# Patient Record
Sex: Female | Born: 1945 | Race: Black or African American | Hispanic: No | Marital: Single | Smoking: Never smoker
Health system: Southern US, Community
[De-identification: ages and names within clinical notes are randomized; demographics above are authoritative.]

## PROBLEM LIST (undated history)

## (undated) DIAGNOSIS — N3281 Overactive bladder: Secondary | ICD-10-CM

## (undated) DIAGNOSIS — Z955 Presence of coronary angioplasty implant and graft: Secondary | ICD-10-CM

## (undated) DIAGNOSIS — I10 Essential (primary) hypertension: Secondary | ICD-10-CM

## (undated) DIAGNOSIS — E7800 Pure hypercholesterolemia, unspecified: Secondary | ICD-10-CM

---

## 2010-08-24 ENCOUNTER — Ambulatory Visit (HOSPITAL_COMMUNITY)
Admission: RE | Admit: 2010-08-24 | Discharge: 2010-08-24 | Payer: Self-pay | Source: Home / Self Care | Admitting: Endocrinology

## 2012-09-17 ENCOUNTER — Ambulatory Visit: Payer: BC Managed Care – PPO

## 2013-11-11 ENCOUNTER — Ambulatory Visit: Payer: Self-pay | Admitting: Endocrinology

## 2013-11-11 ENCOUNTER — Other Ambulatory Visit: Payer: Self-pay

## 2013-11-11 ENCOUNTER — Other Ambulatory Visit: Payer: Self-pay | Admitting: *Deleted

## 2013-11-11 DIAGNOSIS — Z0289 Encounter for other administrative examinations: Secondary | ICD-10-CM

## 2013-11-11 DIAGNOSIS — E039 Hypothyroidism, unspecified: Secondary | ICD-10-CM

## 2019-06-25 ENCOUNTER — Other Ambulatory Visit: Payer: Self-pay | Admitting: *Deleted

## 2019-06-25 DIAGNOSIS — Z20822 Contact with and (suspected) exposure to covid-19: Secondary | ICD-10-CM

## 2019-06-29 LAB — NOVEL CORONAVIRUS, NAA: SARS-CoV-2, NAA: NOT DETECTED

## 2021-06-03 ENCOUNTER — Emergency Department (HOSPITAL_BASED_OUTPATIENT_CLINIC_OR_DEPARTMENT_OTHER): Payer: Medicare Other

## 2021-06-03 ENCOUNTER — Emergency Department (HOSPITAL_COMMUNITY): Payer: Medicare Other

## 2021-06-03 ENCOUNTER — Other Ambulatory Visit: Payer: Self-pay

## 2021-06-03 ENCOUNTER — Encounter (HOSPITAL_BASED_OUTPATIENT_CLINIC_OR_DEPARTMENT_OTHER): Payer: Self-pay | Admitting: Urology

## 2021-06-03 ENCOUNTER — Observation Stay (HOSPITAL_BASED_OUTPATIENT_CLINIC_OR_DEPARTMENT_OTHER)
Admission: EM | Admit: 2021-06-03 | Discharge: 2021-06-04 | Disposition: A | Discharge status: Home / Self Care | Payer: Medicare Other | Attending: Internal Medicine | Admitting: Internal Medicine

## 2021-06-03 DIAGNOSIS — N1831 Chronic kidney disease, stage 3a: Secondary | ICD-10-CM | POA: Insufficient documentation

## 2021-06-03 DIAGNOSIS — I2510 Atherosclerotic heart disease of native coronary artery without angina pectoris: Secondary | ICD-10-CM | POA: Insufficient documentation

## 2021-06-03 DIAGNOSIS — E1122 Type 2 diabetes mellitus with diabetic chronic kidney disease: Secondary | ICD-10-CM | POA: Insufficient documentation

## 2021-06-03 DIAGNOSIS — I63219 Cerebral infarction due to unspecified occlusion or stenosis of unspecified vertebral arteries: Secondary | ICD-10-CM | POA: Diagnosis not present

## 2021-06-03 DIAGNOSIS — I639 Cerebral infarction, unspecified: Secondary | ICD-10-CM | POA: Diagnosis present

## 2021-06-03 DIAGNOSIS — R55 Syncope and collapse: Secondary | ICD-10-CM | POA: Insufficient documentation

## 2021-06-03 DIAGNOSIS — Z20822 Contact with and (suspected) exposure to covid-19: Secondary | ICD-10-CM | POA: Insufficient documentation

## 2021-06-03 DIAGNOSIS — R200 Anesthesia of skin: Secondary | ICD-10-CM | POA: Diagnosis present

## 2021-06-03 DIAGNOSIS — Z79899 Other long term (current) drug therapy: Secondary | ICD-10-CM | POA: Diagnosis not present

## 2021-06-03 DIAGNOSIS — E039 Hypothyroidism, unspecified: Secondary | ICD-10-CM | POA: Diagnosis not present

## 2021-06-03 DIAGNOSIS — I129 Hypertensive chronic kidney disease with stage 1 through stage 4 chronic kidney disease, or unspecified chronic kidney disease: Secondary | ICD-10-CM | POA: Insufficient documentation

## 2021-06-03 DIAGNOSIS — I10 Essential (primary) hypertension: Secondary | ICD-10-CM | POA: Diagnosis not present

## 2021-06-03 DIAGNOSIS — R202 Paresthesia of skin: Principal | ICD-10-CM

## 2021-06-03 HISTORY — DX: Essential (primary) hypertension: I10

## 2021-06-03 HISTORY — DX: Presence of coronary angioplasty implant and graft: Z95.5

## 2021-06-03 HISTORY — DX: Overactive bladder: N32.81

## 2021-06-03 HISTORY — DX: Pure hypercholesterolemia, unspecified: E78.00

## 2021-06-03 LAB — CBC
HCT: 38.4 % (ref 36.0–46.0)
HCT: 38.8 % (ref 36.0–46.0)
Hemoglobin: 12 g/dL (ref 12.0–15.0)
Hemoglobin: 11.9 g/dL — ABNORMAL LOW (ref 12.0–15.0)
MCH: 26.1 pg (ref 26.0–34.0)
MCH: 26.3 pg (ref 26.0–34.0)
MCHC: 31.3 g/dL (ref 30.0–36.0)
MCHC: 30.7 g/dL (ref 30.0–36.0)
MCV: 83.7 fL (ref 80.0–100.0)
MCV: 85.7 fL (ref 80.0–100.0)
Platelets: 275 10*3/uL (ref 150–400)
Platelets: 286 10*3/uL (ref 150–400)
RBC: 4.59 MIL/uL (ref 3.87–5.11)
RBC: 4.53 MIL/uL (ref 3.87–5.11)
RDW: 15.4 % (ref 11.5–15.5)
RDW: 15.3 % (ref 11.5–15.5)
WBC: 15.8 10*3/uL — ABNORMAL HIGH (ref 4.0–10.5)
WBC: 12.5 10*3/uL — ABNORMAL HIGH (ref 4.0–10.5)
nRBC: 0 % (ref 0.0–0.2)
nRBC: 0 % (ref 0.0–0.2)

## 2021-06-03 LAB — LIPID PANEL
Cholesterol: 129 mg/dL (ref 0–200)
HDL: 60 mg/dL (ref >40)
LDL Cholesterol: 53 mg/dL (ref 0–99)
Total CHOL/HDL Ratio: 2.2 RATIO
Triglycerides: 80 mg/dL (ref <150)
VLDL: 16 mg/dL (ref 0–40)

## 2021-06-03 LAB — URINALYSIS, ROUTINE W REFLEX MICROSCOPIC
Bilirubin Urine: NEGATIVE
Glucose, UA: NEGATIVE mg/dL
Ketones, ur: NEGATIVE mg/dL
Nitrite: NEGATIVE
Protein, ur: 30 mg/dL — AB
Specific Gravity, Urine: 1.016 (ref 1.005–1.030)
pH: 7 (ref 5.0–8.0)

## 2021-06-03 LAB — COMPREHENSIVE METABOLIC PANEL
ALT: 37 U/L (ref 0–44)
AST: 31 U/L (ref 15–41)
Albumin: 4.1 g/dL (ref 3.5–5.0)
Alkaline Phosphatase: 100 U/L (ref 38–126)
Anion gap: 10 (ref 5–15)
BUN: 23 mg/dL (ref 8–23)
CO2: 25 mmol/L (ref 22–32)
Calcium: 8.6 mg/dL — ABNORMAL LOW (ref 8.9–10.3)
Chloride: 104 mmol/L (ref 98–111)
Creatinine, Ser: 1.26 mg/dL — ABNORMAL HIGH (ref 0.44–1.00)
GFR, Estimated: 45 mL/min — ABNORMAL LOW (ref >60)
Glucose, Bld: 125 mg/dL — ABNORMAL HIGH (ref 70–99)
Potassium: 3.2 mmol/L — ABNORMAL LOW (ref 3.5–5.1)
Sodium: 139 mmol/L (ref 135–145)
Total Bilirubin: 0.5 mg/dL (ref 0.3–1.2)
Total Protein: 8 g/dL (ref 6.5–8.1)

## 2021-06-03 LAB — DIFFERENTIAL
Abs Immature Granulocytes: 0.07 10*3/uL (ref 0.00–0.07)
Basophils Absolute: 0 10*3/uL (ref 0.0–0.1)
Basophils Relative: 0 %
Eosinophils Absolute: 0.1 10*3/uL (ref 0.0–0.5)
Eosinophils Relative: 1 %
Immature Granulocytes: 0 %
Lymphocytes Relative: 14 %
Lymphs Abs: 2.2 10*3/uL (ref 0.7–4.0)
Monocytes Absolute: 1 10*3/uL (ref 0.1–1.0)
Monocytes Relative: 6 %
Neutro Abs: 12.4 10*3/uL — ABNORMAL HIGH (ref 1.7–7.7)
Neutrophils Relative %: 79 %

## 2021-06-03 LAB — RAPID URINE DRUG SCREEN, HOSP PERFORMED
Amphetamines: NOT DETECTED
Barbiturates: NOT DETECTED
Benzodiazepines: NOT DETECTED
Cocaine: NOT DETECTED
Opiates: NOT DETECTED
Tetrahydrocannabinol: NOT DETECTED

## 2021-06-03 LAB — TROPONIN I (HIGH SENSITIVITY)
Troponin I (High Sensitivity): 11 ng/L (ref <18)
Troponin I (High Sensitivity): 17 ng/L (ref <18)

## 2021-06-03 LAB — RESP PANEL BY RT-PCR (FLU A&B, COVID) ARPGX2
Influenza A by PCR: NEGATIVE
Influenza B by PCR: NEGATIVE
SARS Coronavirus 2 by RT PCR: NEGATIVE

## 2021-06-03 LAB — APTT: aPTT: 29 seconds (ref 24–36)

## 2021-06-03 LAB — PROTIME-INR
INR: 1.1 (ref 0.8–1.2)
Prothrombin Time: 14.3 seconds (ref 11.4–15.2)

## 2021-06-03 LAB — CREATININE, SERUM
Creatinine, Ser: 1.1 mg/dL — ABNORMAL HIGH (ref 0.44–1.00)
GFR, Estimated: 52 mL/min — ABNORMAL LOW (ref >60)

## 2021-06-03 LAB — TSH: TSH: 2.525 u[IU]/mL (ref 0.350–4.500)

## 2021-06-03 LAB — MAGNESIUM: Magnesium: 2.4 mg/dL (ref 1.7–2.4)

## 2021-06-03 LAB — PHOSPHORUS: Phosphorus: 2.4 mg/dL — ABNORMAL LOW (ref 2.5–4.6)

## 2021-06-03 LAB — ETHANOL: Alcohol, Ethyl (B): <10 mg/dL (ref <10)

## 2021-06-03 IMAGING — MR MR MRA HEAD W/O CM
1 of 2 series · 19 of 48 positions shown · IV contrast (gadavist)
Comparison: None.

CLINICAL DATA: Stroke follow-up

EXAM:
MRA NECK WITHOUT AND WITH CONTRAST
MRA HEAD WITHOUT CONTRAST
TECHNIQUE: Multiplanar and multiecho pulse sequences of the neck were obtained
without and with intravenous contrast. Angiographic images of the
neck were obtained using MRA technique without and with intravenous
contrast; Angiographic images of the Circle of Willis were obtained
using MRA technique without intravenous contrast.
CONTRAST:  9mL GADAVIST GADOBUTROL 1 MMOL/ML IV SOLN

[Series 3: ax (id) · axial · 1.0mm · 0.43mm/px · z∈[-99,-13]mm · 19 of 187 slices shown]
[im 1/187]
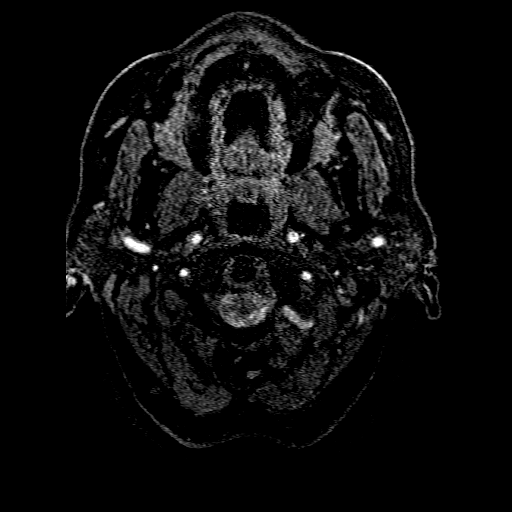
[im 8/187]
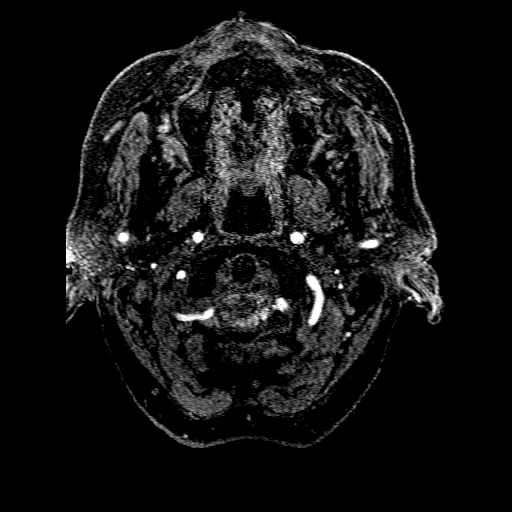
[im 15/187]
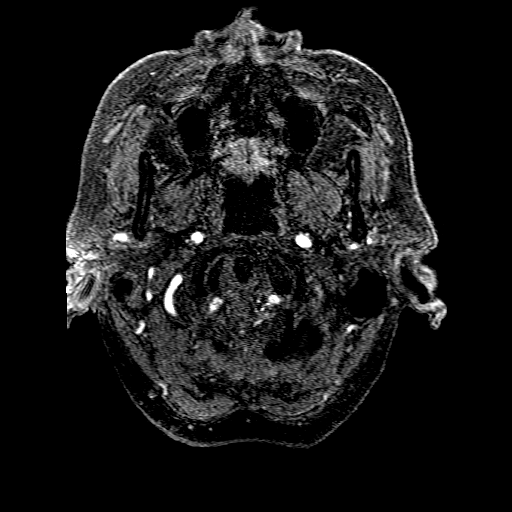
[im 23/187]
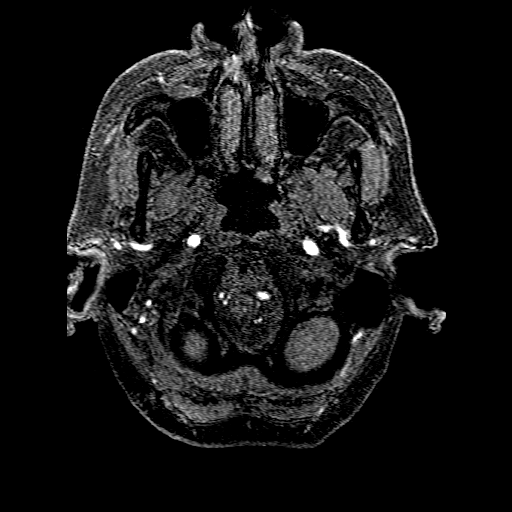
[im 30/187]
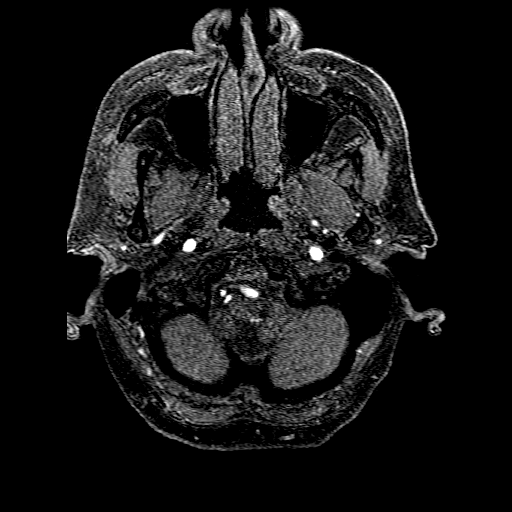
[im 38/187]
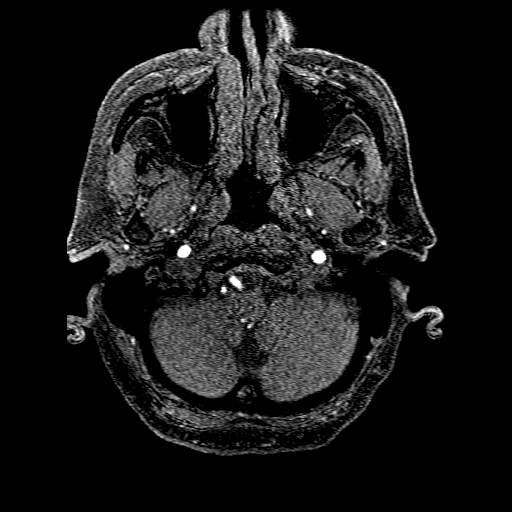
[im 45/187]
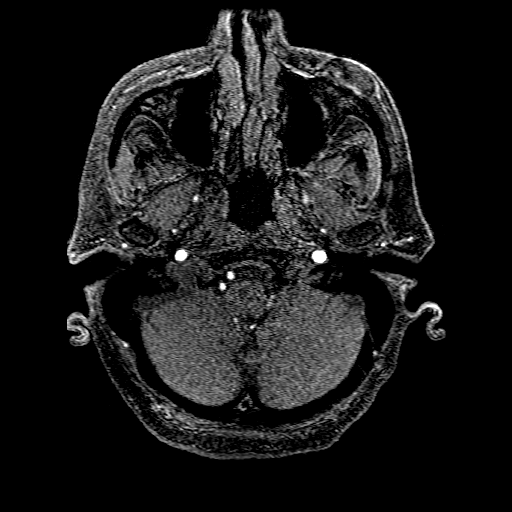
[im 53/187]
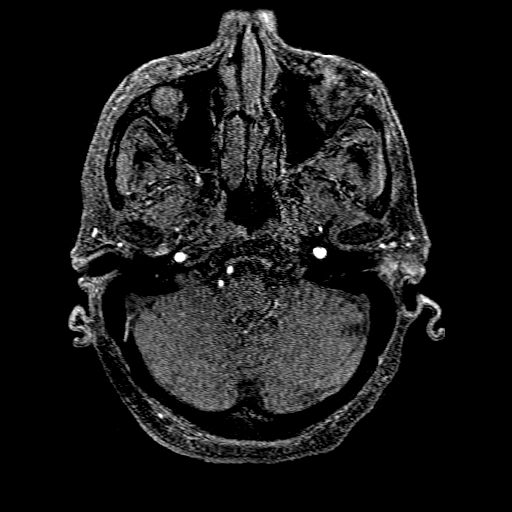
[im 60/187]
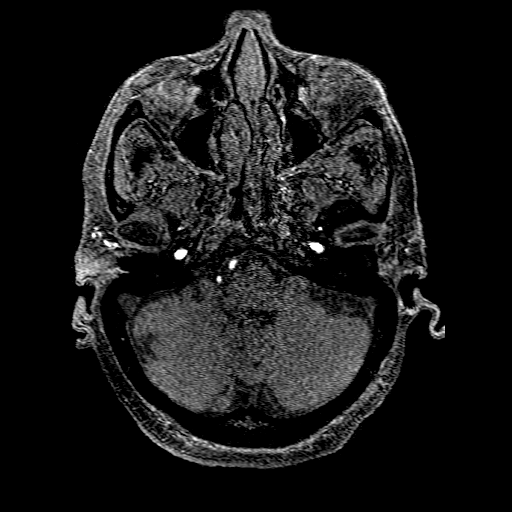
[im 67/187]
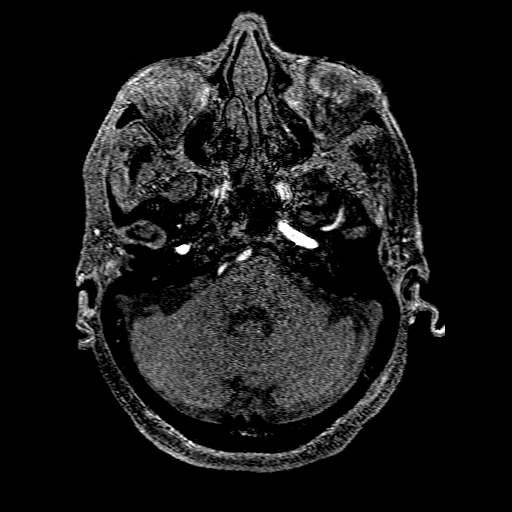
[im 75/187]
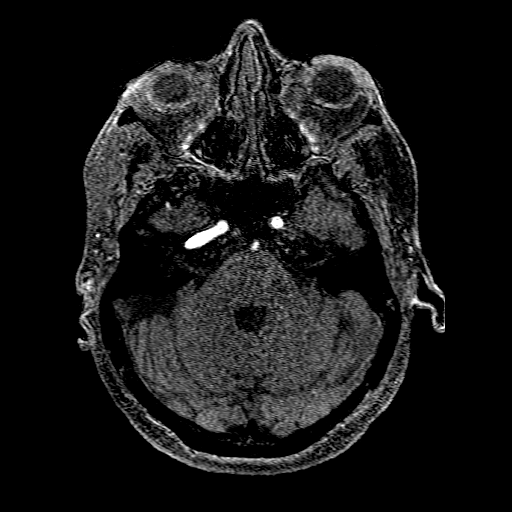
[im 82/187]
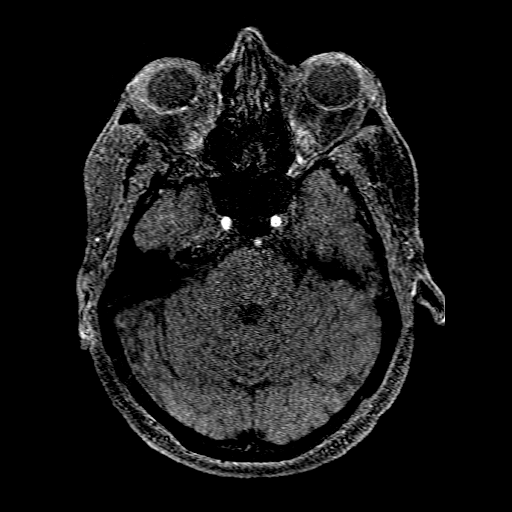
[im 90/187]
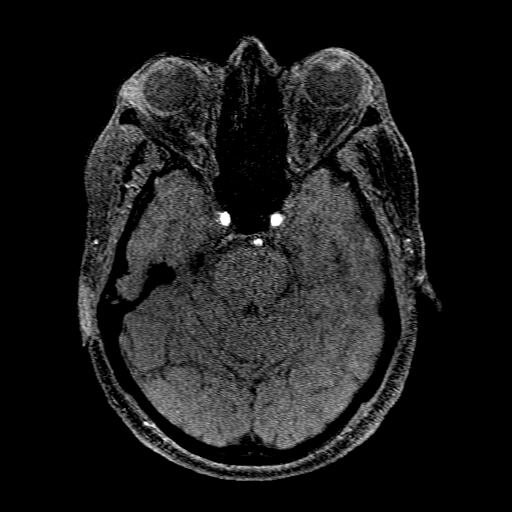
[im 97/187]
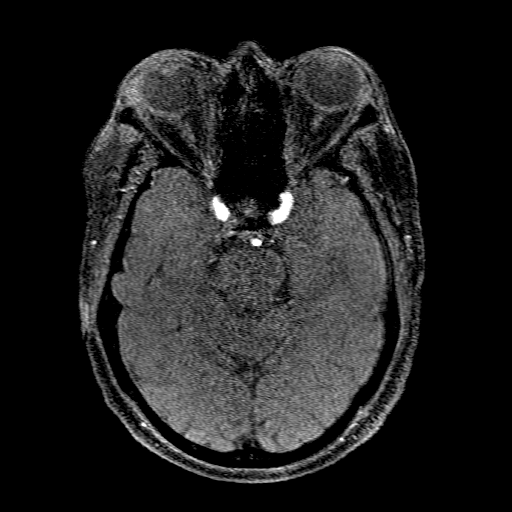
[im 105/187]
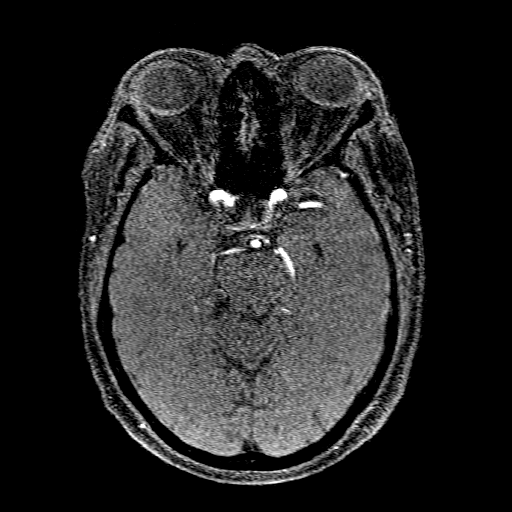
[im 112/187]
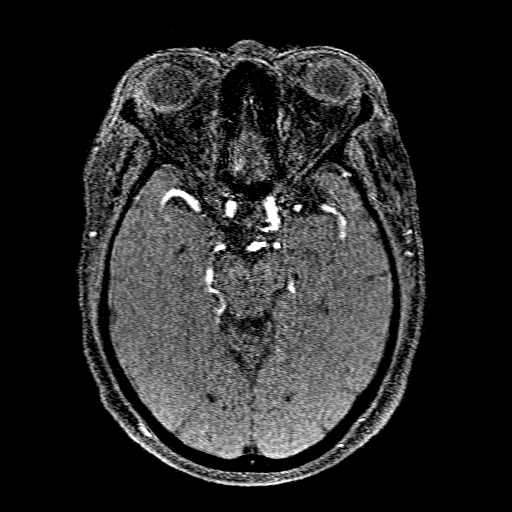
[im 127/187]
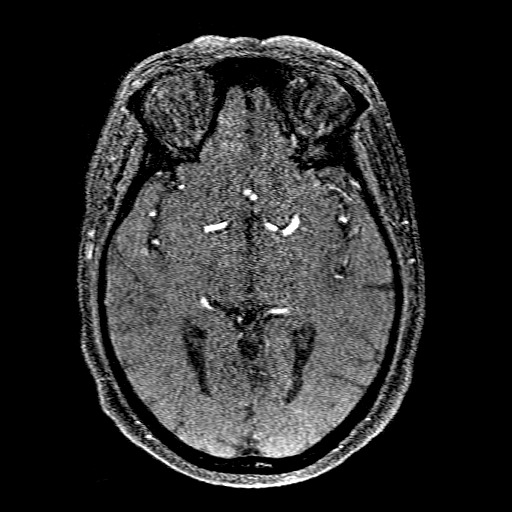
[im 157/187]
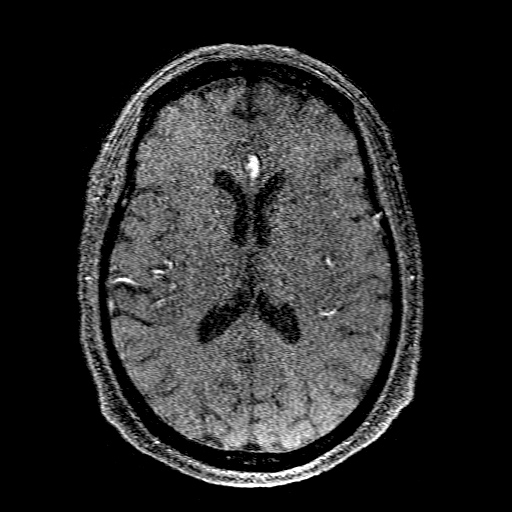
[im 179/187]
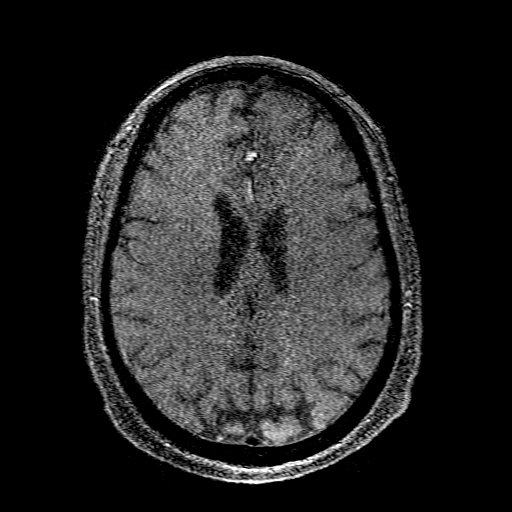

[19 of 48 positions shown; findings below may reference images not displayed]

FINDINGS: MRA NECK FINDINGS

Normal carotid and vertebral artery systems within the neck.

MRA HEAD FINDINGS

POSTERIOR CIRCULATION:

--Vertebral arteries: Normal

--Inferior cerebellar arteries: Normal.

--Basilar artery: Normal.

--Superior cerebellar arteries: Normal.

--Posterior cerebral arteries: Normal.

ANTERIOR CIRCULATION:

--Intracranial internal carotid arteries: Normal.

--Anterior cerebral arteries (ACA): Normal.

--Middle cerebral arteries (MCA): Normal.

ANATOMIC VARIANTS: None
IMPRESSION: Normal MRA of the head and neck.

## 2021-06-03 IMAGING — CT CT HEAD W/O CM
3 series · 15 of 47 positions shown, 18 images · non-contrast
Comparison: None.

CLINICAL DATA: Reported recent CVA

EXAM:
CT HEAD WITHOUT CONTRAST
TECHNIQUE: Contiguous axial images were obtained from the base of the skull
through the vertex without intravenous contrast.

[Series 2: head wo · axial · 0.44mm/px · z∈[+773,+898]mm · 9 of 30 slices shown, 12 images]
[im 3/30  brain]
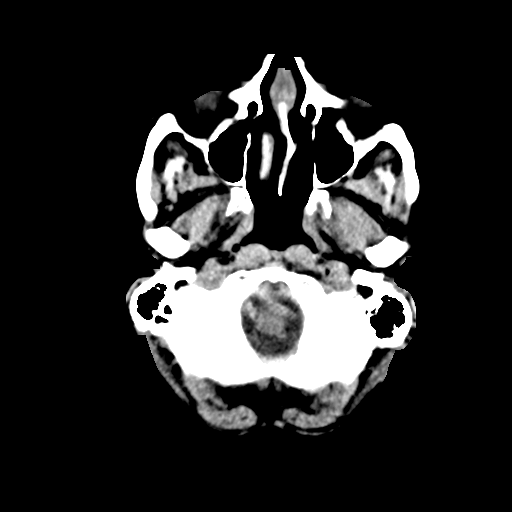
[im 3/30  bone]
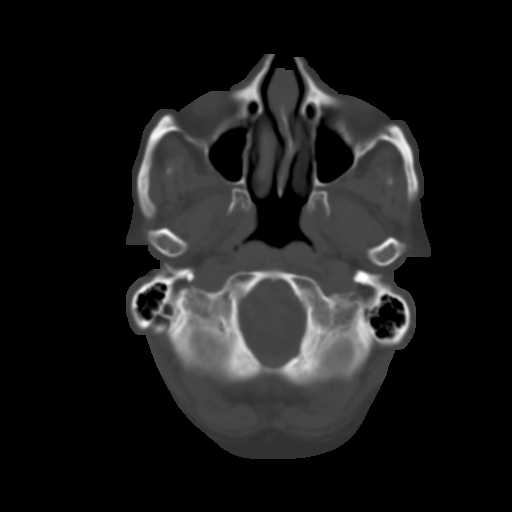
[im 6/30  brain]
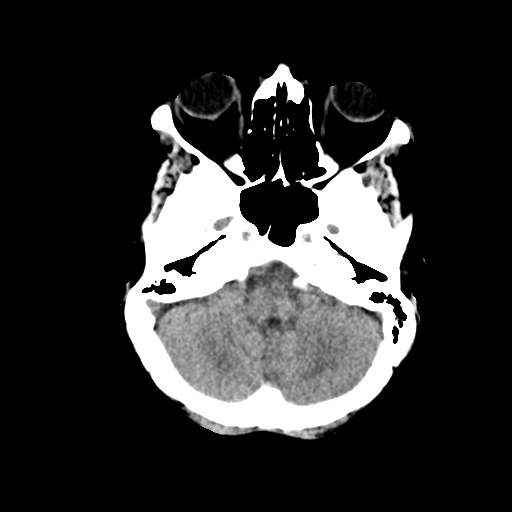
[im 9/30  brain]
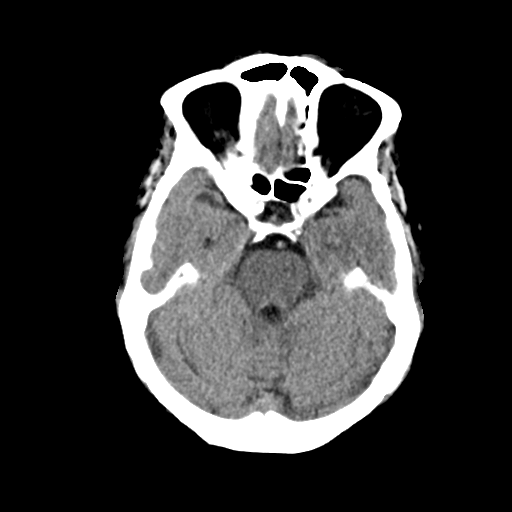
[im 12/30  brain]
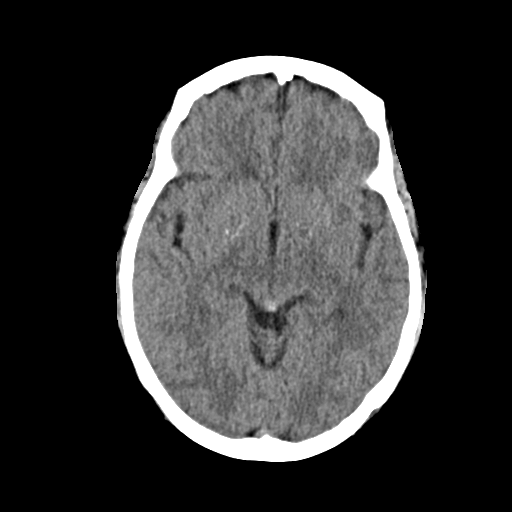
[im 16/30  brain]
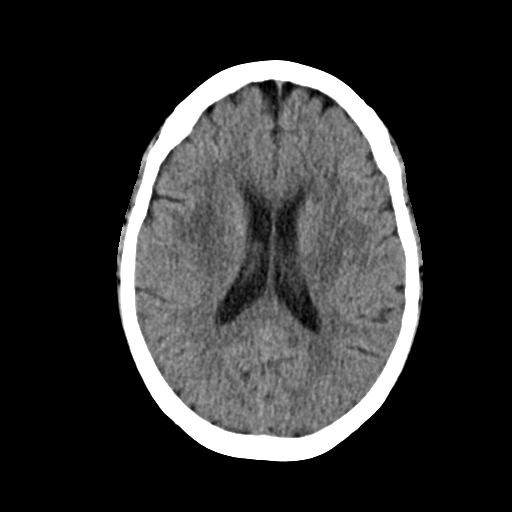
[im 16/30  bone]
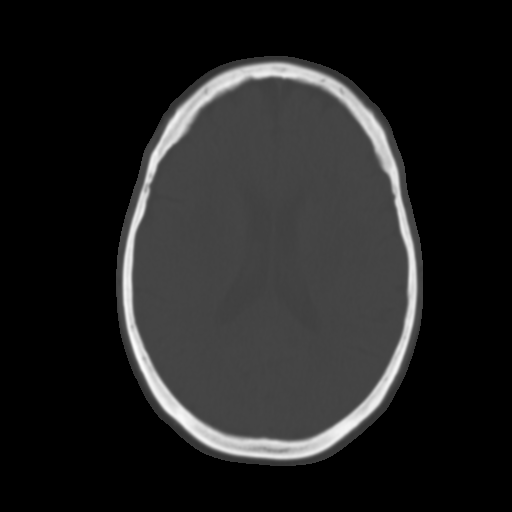
[im 19/30  brain]
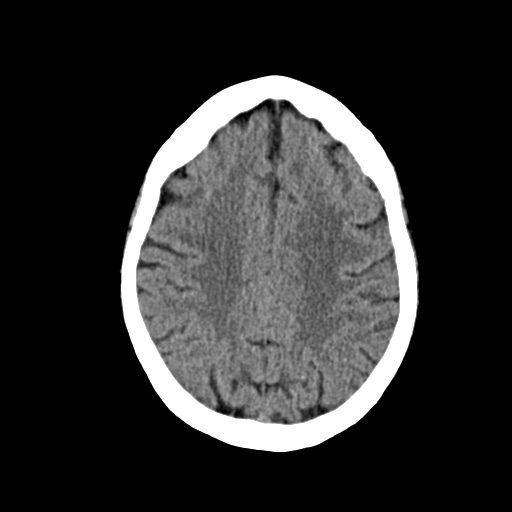
[im 22/30  brain]
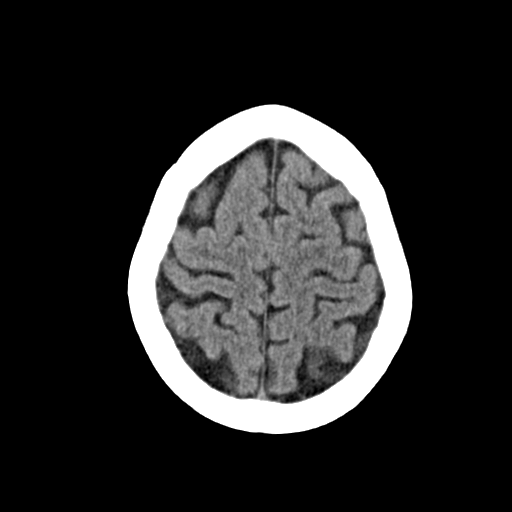
[im 25/30  brain]
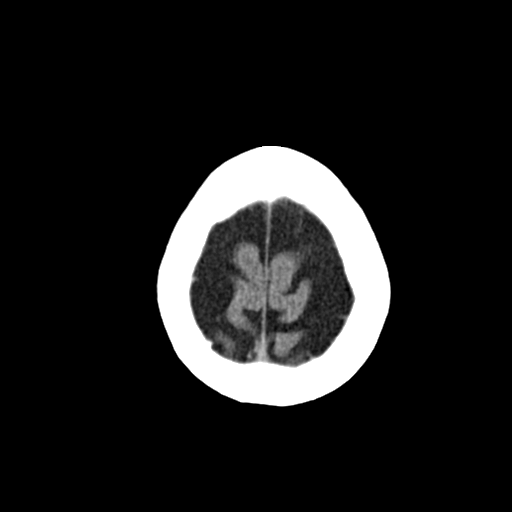
[im 28/30  brain]
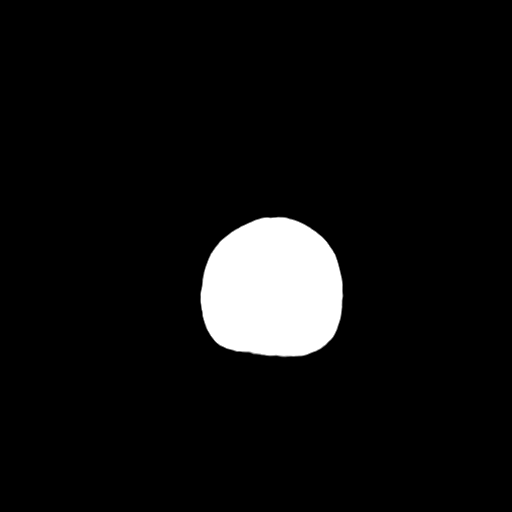
[im 28/30  bone]
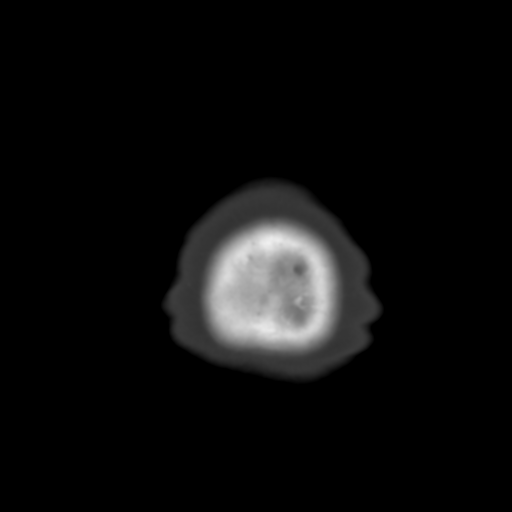

[Series 4: coronal soft · coronal · 0.31mm/px · 3 of 66 slices shown]
[im 22/66  brain]
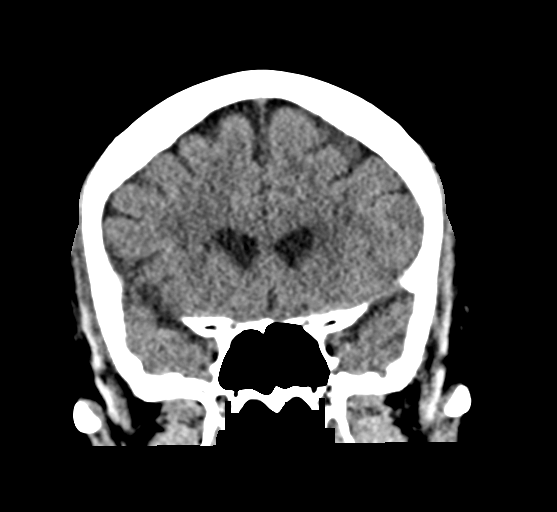
[im 29/66  brain]
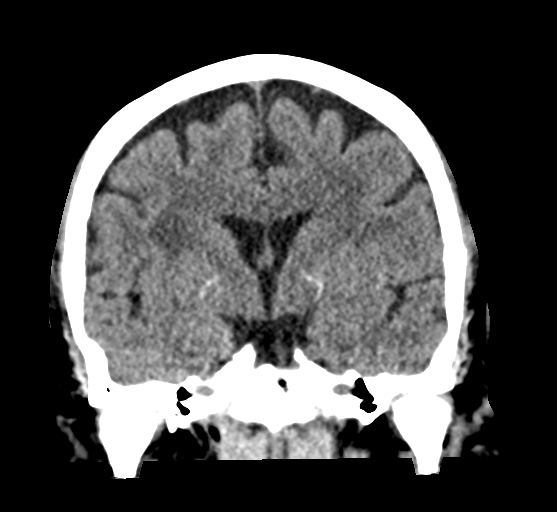
[im 37/66  brain]
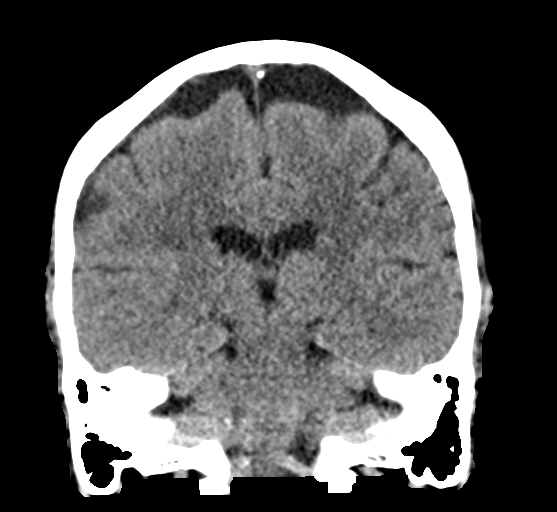

[Series 5: sag soft · sagittal · 0.31mm/px · 3 of 55 slices shown]
[im 19/55  brain]
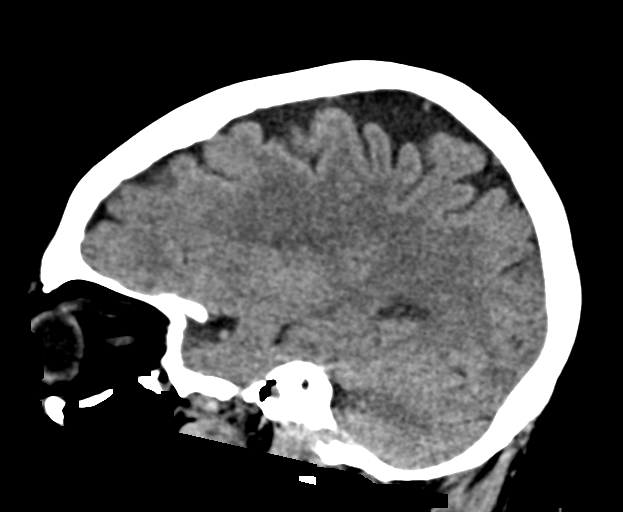
[im 28/55  brain]
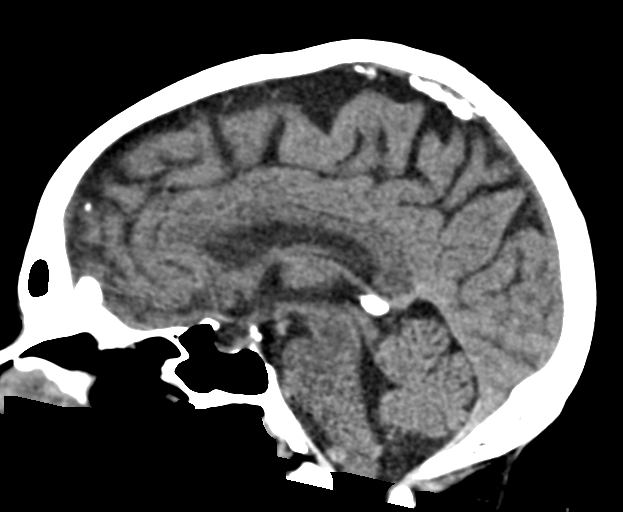
[im 37/55  brain]
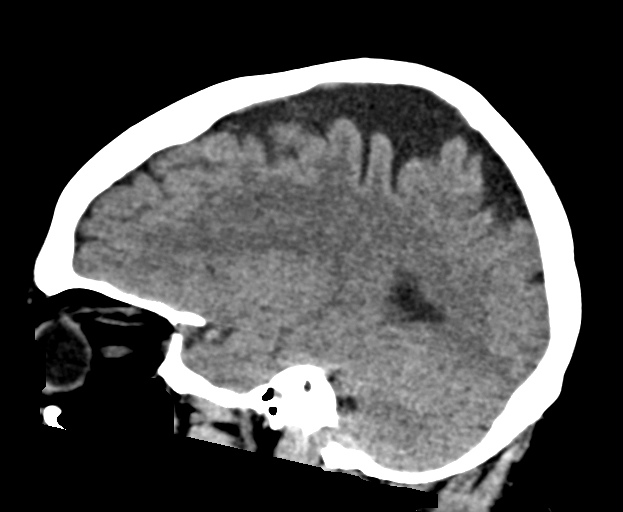

[15 of 47 positions shown; findings below may reference images not displayed]

FINDINGS: Brain: There is symmetric superior frontal and parietal lobe
atrophy. There is no intracranial mass, hemorrhage, extra-axial
fluid collection, or midline shift. There is patchy decreased
attenuation in portions of each centrum semiovale consistent with
underlying periventricular small vessel disease. No well-defined
acute infarct is evident on this study.

Vascular: No hyperdense vessel. There is calcification in each
carotid siphon region.

Skull: Bony calvarium appears intact.

Sinuses/Orbits: Slight mucosal thickening in several ethmoid air
cells. Other visualized paranasal sinuses are clear. Orbits appear
symmetric bilaterally.

Other: Mastoid air cells clear.
IMPRESSION: Areas of superior frontal and parietal lobe atrophy. Patchy
periventricular small vessel disease noted. No acute infarct is
appreciable on this study. No mass or hemorrhage.

There are foci of arterial vascular calcification. There is mild
ethmoid paranasal sinus disease.

## 2021-06-03 IMAGING — MR MR MRA NECK WO/W CM
4 of 6 series · 19 of 48 positions shown · IV contrast (Yes GAD)
Comparison: None.

CLINICAL DATA: Stroke follow-up

EXAM:
MRA NECK WITHOUT AND WITH CONTRAST
MRA HEAD WITHOUT CONTRAST
TECHNIQUE: Multiplanar and multiecho pulse sequences of the neck were obtained
without and with intravenous contrast. Angiographic images of the
neck were obtained using MRA technique without and with intravenous
contrast; Angiographic images of the Circle of Willis were obtained
using MRA technique without intravenous contrast.
CONTRAST:  9mL GADAVIST GADOBUTROL 1 MMOL/ML IV SOLN

[Series 600: cor cemra ft · coronal · 1.2mm · 0.59mm/px · 9 of 109 slices shown]
[im 1/109]
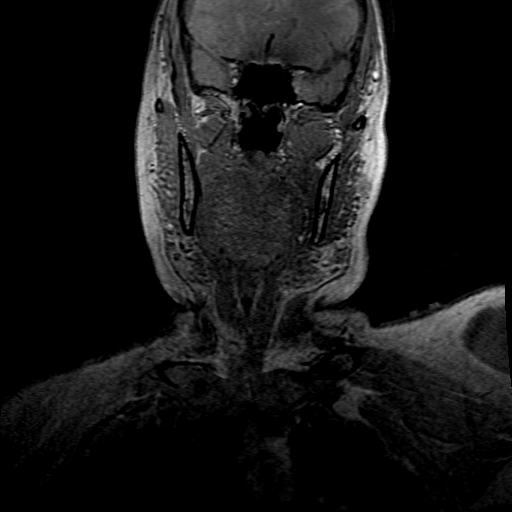
[im 14/109]
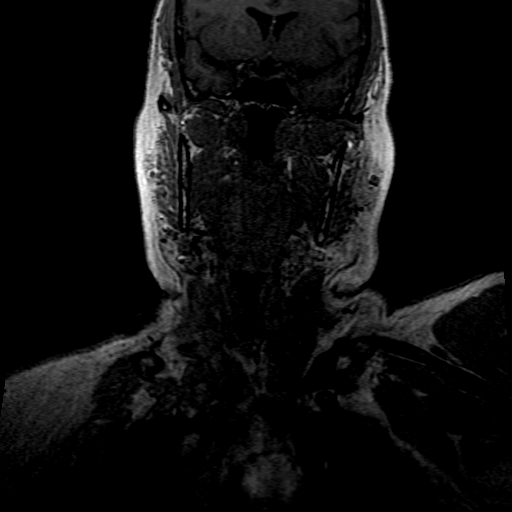
[im 28/109]
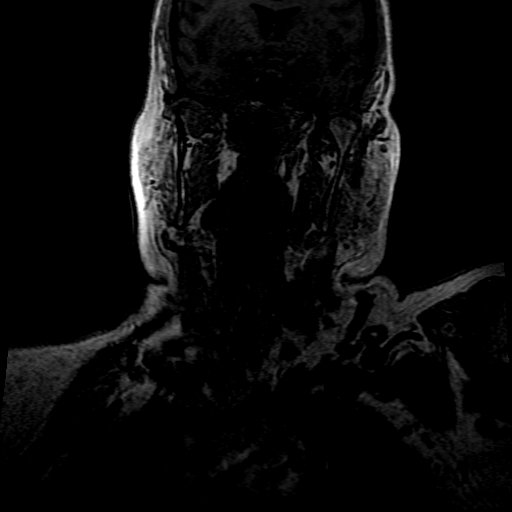
[im 41/109]
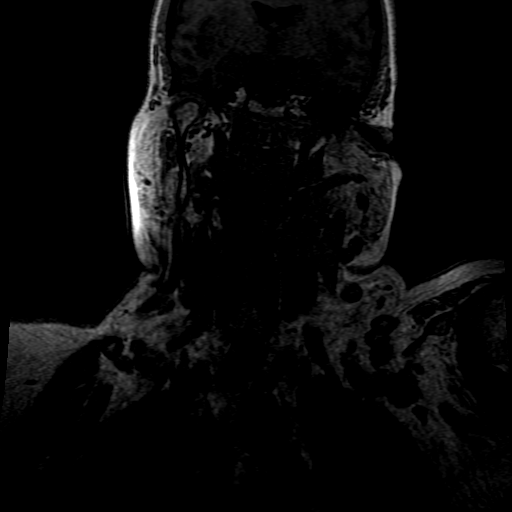
[im 55/109]
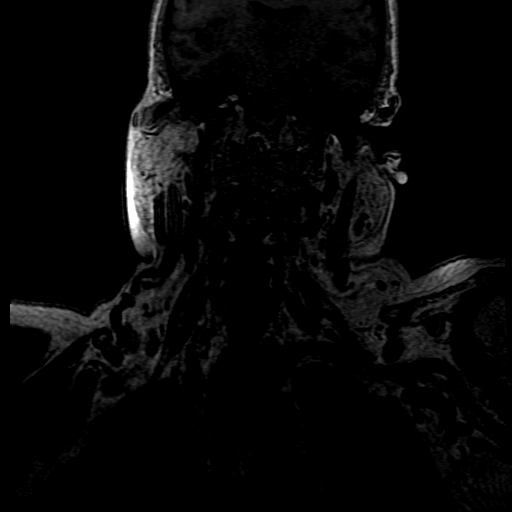
[im 68/109]
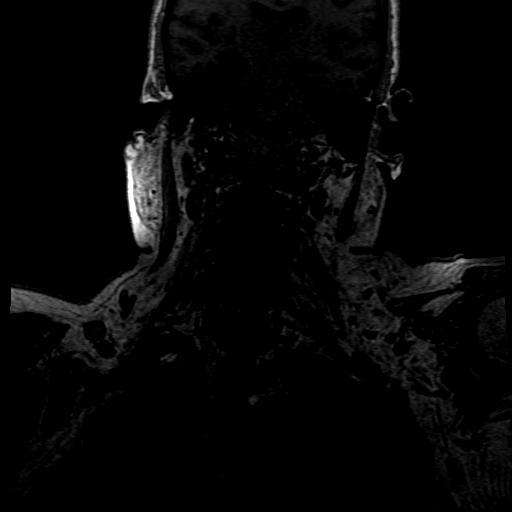
[im 82/109]
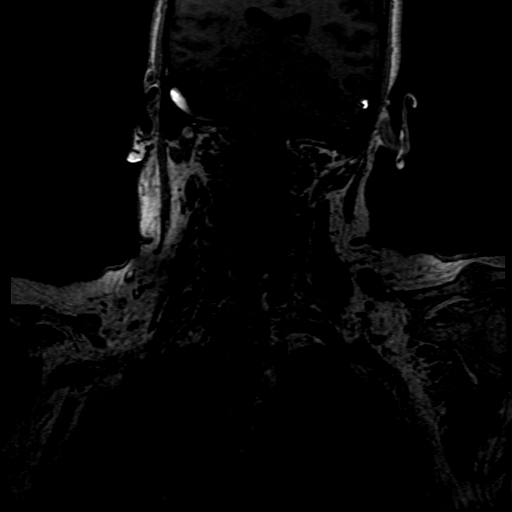
[im 95/109]
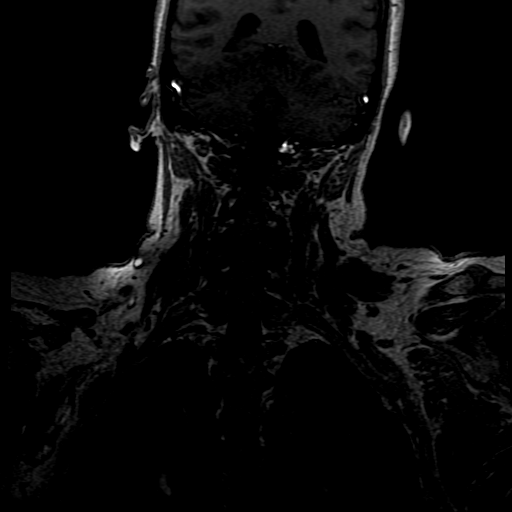
[im 109/109]
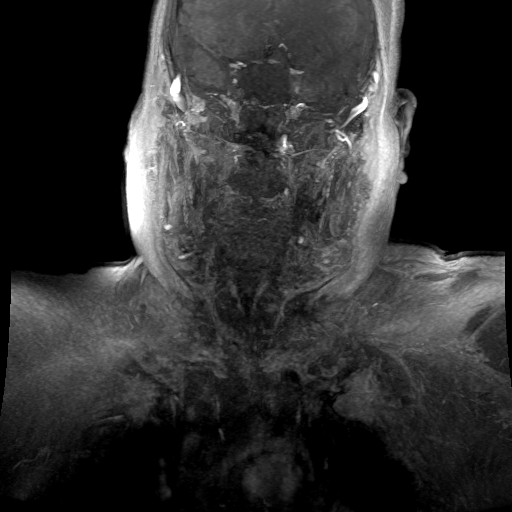

[Series 601: ph1/cor cemra ft · coronal · 1.2mm · 0.59mm/px · 4 of 109 slices shown]
[im 1/109]
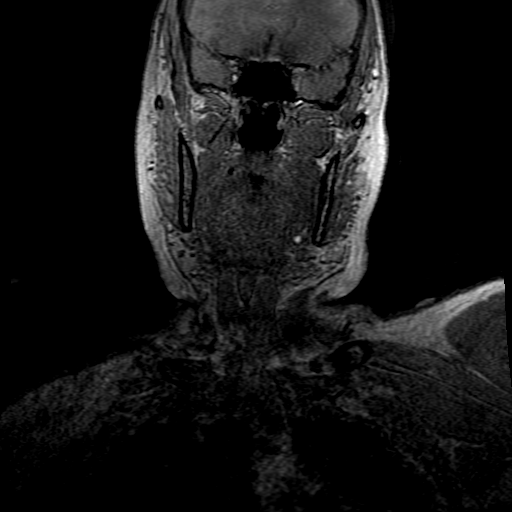
[im 14/109]
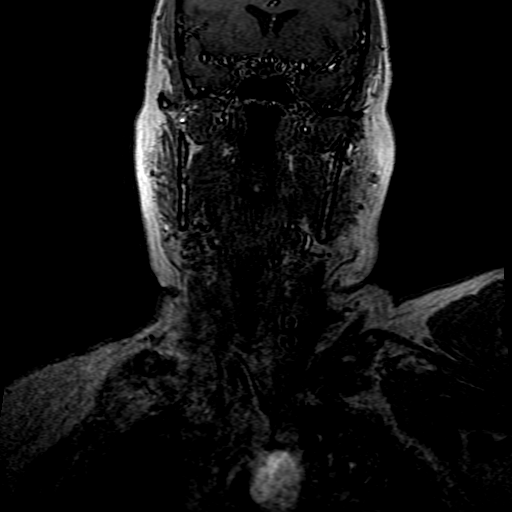
[im 55/109]
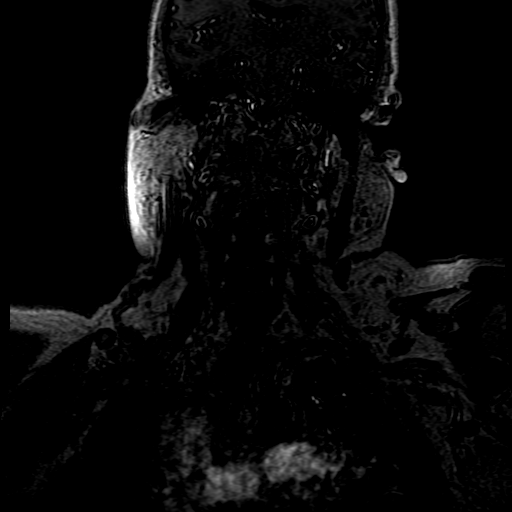
[im 95/109]
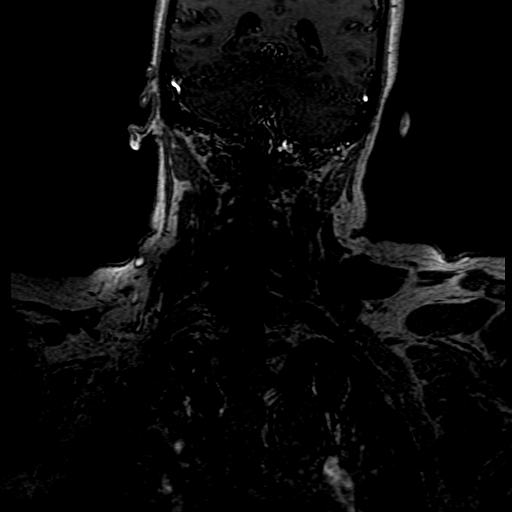

[Series 602: ph2/cor cemra ft · coronal · 1.2mm · 0.59mm/px · 3 of 108 slices shown]
[im 14/108]
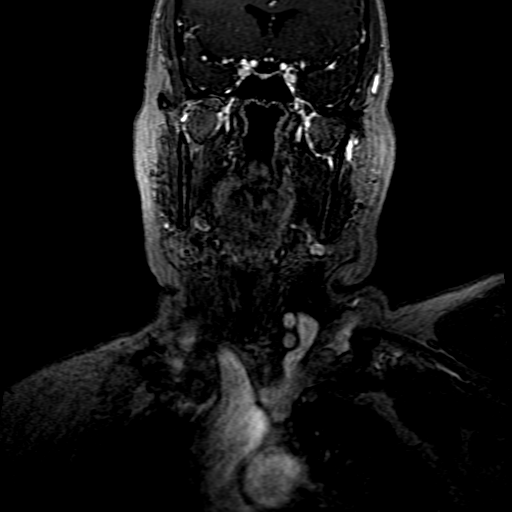
[im 54/108]
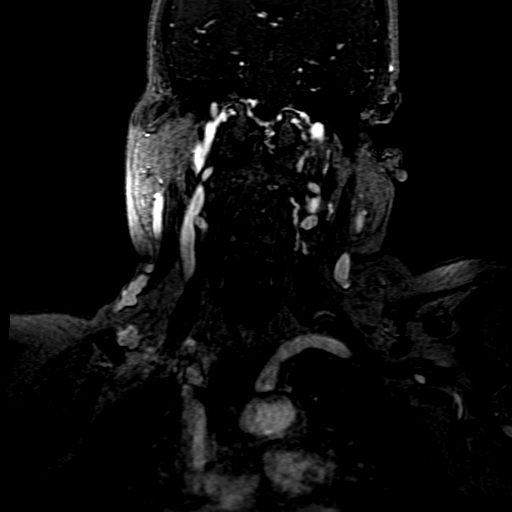
[im 94/108]
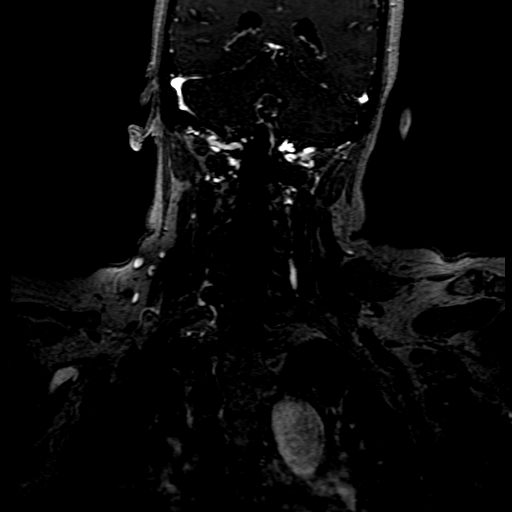

[((date))-((date)) · coronal · 1.2mm · 0.59mm/px · 3 of 109 slices shown]
[im 13/109]
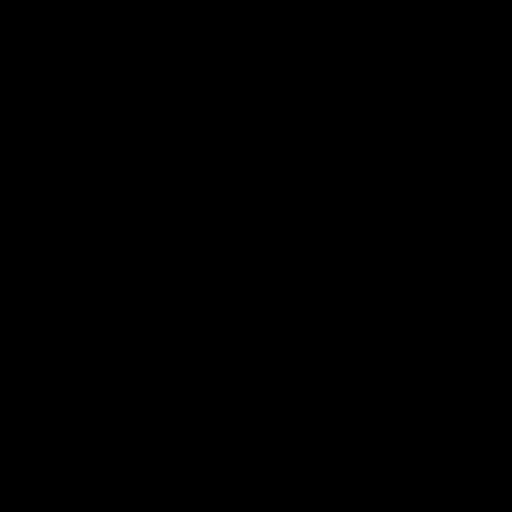
[im 61/109]
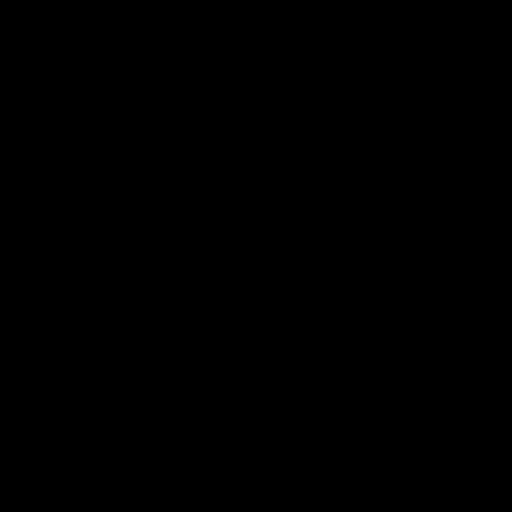
[im 97/109]
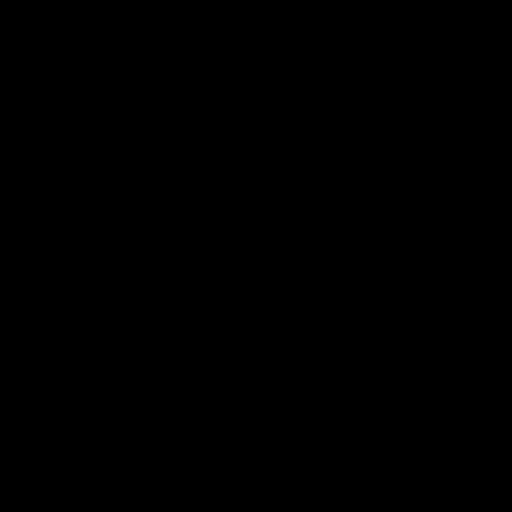

[19 of 48 positions shown; findings below may reference images not displayed]

FINDINGS: MRA NECK FINDINGS

Normal carotid and vertebral artery systems within the neck.

MRA HEAD FINDINGS

POSTERIOR CIRCULATION:

--Vertebral arteries: Normal

--Inferior cerebellar arteries: Normal.

--Basilar artery: Normal.

--Superior cerebellar arteries: Normal.

--Posterior cerebral arteries: Normal.

ANTERIOR CIRCULATION:

--Intracranial internal carotid arteries: Normal.

--Anterior cerebral arteries (ACA): Normal.

--Middle cerebral arteries (MCA): Normal.

ANATOMIC VARIANTS: None
IMPRESSION: Normal MRA of the head and neck.

## 2021-06-03 IMAGING — MR MR HEAD W/O CM
12 of 13 series · 44 of 48 positions shown · non-contrast
Comparison: Head CT same day

CLINICAL DATA: Numbness, tingling and paresthesia.

EXAM:
MRI HEAD WITHOUT CONTRAST
TECHNIQUE: Multiplanar, multiecho pulse sequences of the brain and surrounding
structures were obtained without intravenous contrast.

[Series 5: DWI · axial · 3.0mm · 0.88mm/px · z∈[-103,+37]mm · 8 of 96 slices shown (1 of 4)]
[im 1/96]
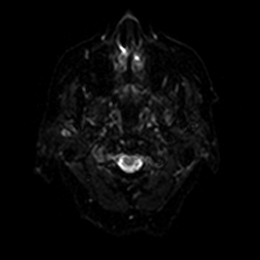
[im 14/96]
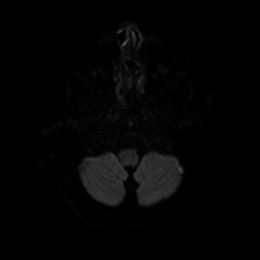
[im 28/96]
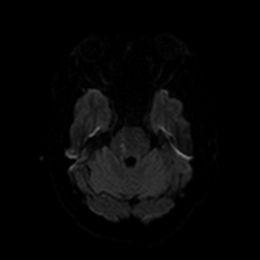
[im 41/96]
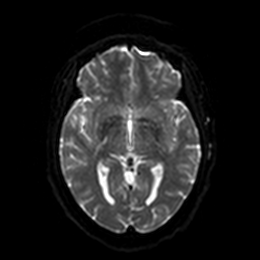
[im 55/96]
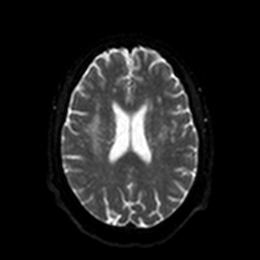
[im 68/96]
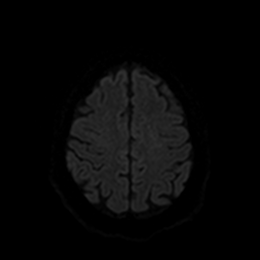
[im 82/96]
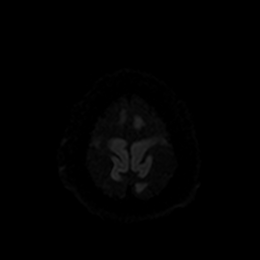
[im 96/96]
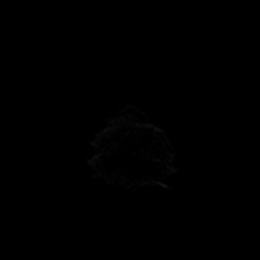

[Series 6: DWI · axial · 3.0mm · 0.88mm/px · z∈[-103,+37]mm · 4 of 48 slices shown (2 of 4)]
[im 1/48]
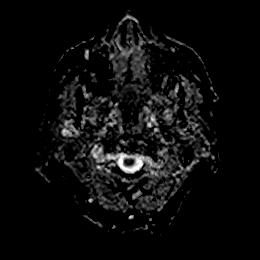
[im 16/48]
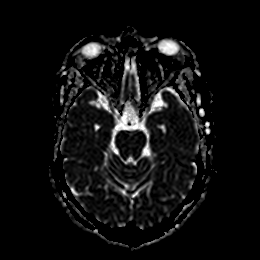
[im 32/48]
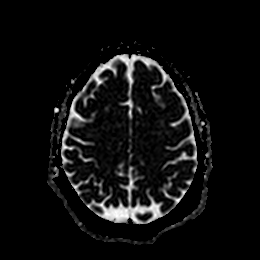
[im 48/48]
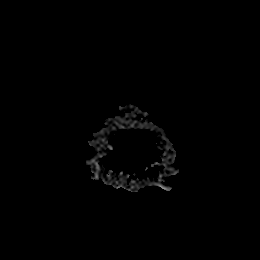

[Series 7: DWI · coronal · 4.0mm · 0.88mm/px · 5 of 70 slices shown (3 of 4)]
[im 1/70]
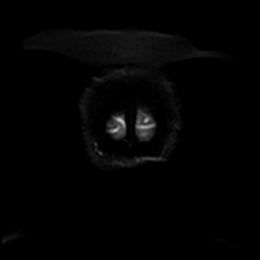
[im 18/70]
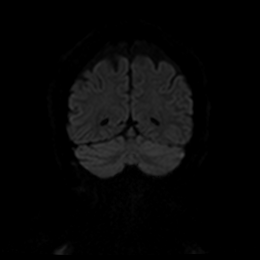
[im 35/70]
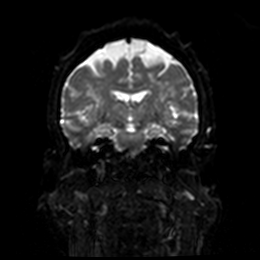
[im 52/70]
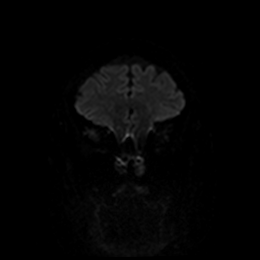
[im 70/70]
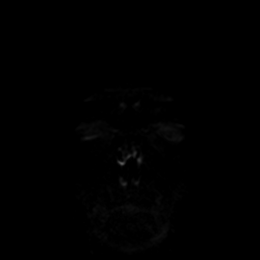

[Series 8: DWI · coronal · 4.0mm · 0.88mm/px · 3 of 35 slices shown (4 of 4)]
[im 1/35]
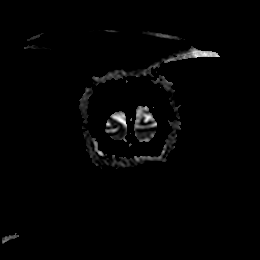
[im 18/35]
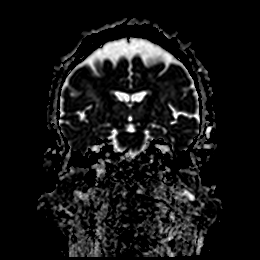
[im 35/35]
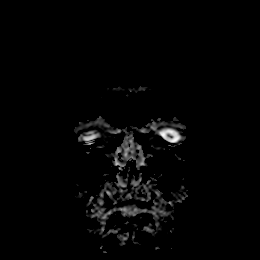

[Series 9: T1 · sagittal · 5.0mm · 0.75mm/px · 2 of 23 slices shown]
[im 1/23]
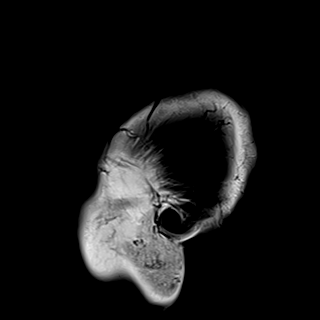
[im 23/23]
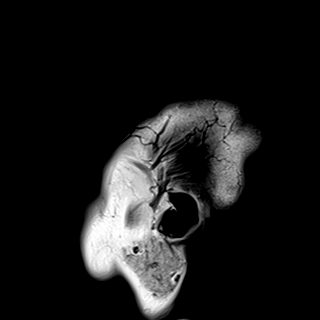

[Series 10: T2 · axial · 5.0mm · 0.72mm/px · z∈[-106,+38]mm · 2 of 25 slices shown (1 of 2)]
[im 1/25]
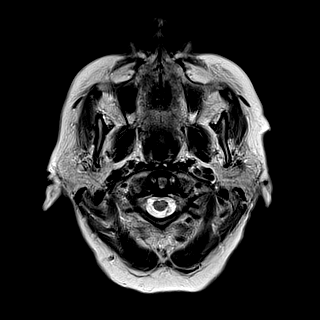
[im 25/25]
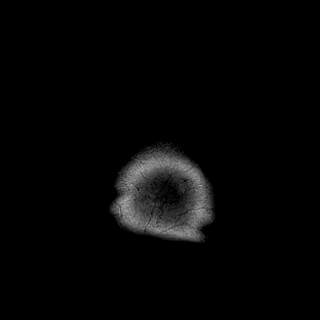

[Series 11: FLAIR · axial · 5.0mm · 0.45mm/px · z∈[-106,+38]mm · 2 of 25 slices shown]
[im 1/25]
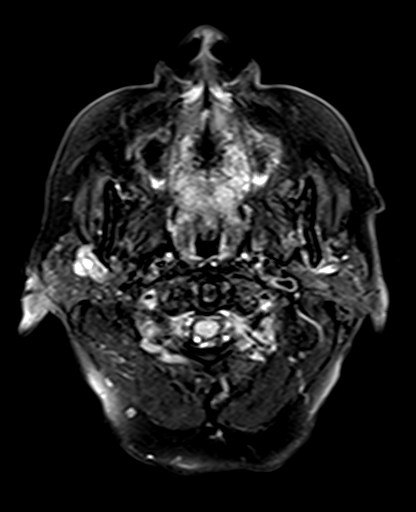
[im 25/25]
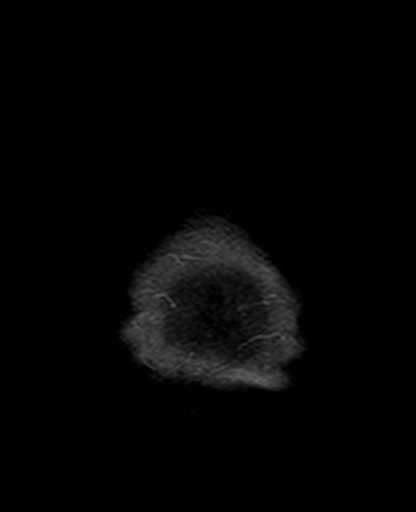

[Series 12: mag_images · axial · 3.0mm · 0.90mm/px · z∈[-124,+53]mm · 4 of 60 slices shown]
[im 1/60]
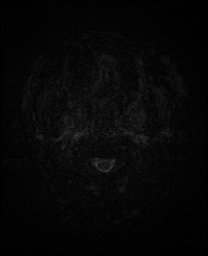
[im 20/60]
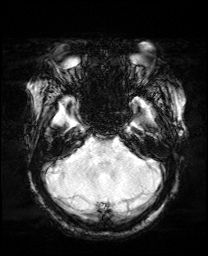
[im 40/60]
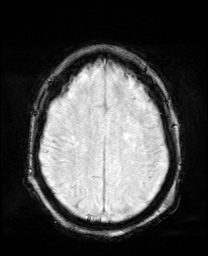
[im 60/60]
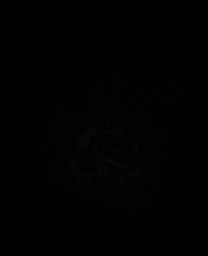

[Series 13: pha_images · axial · 3.0mm · 0.90mm/px · z∈[-124,+50]mm · 4 of 59 slices shown]
[im 1/59]
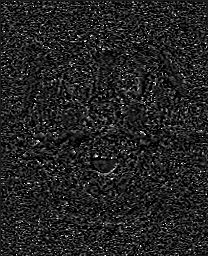
[im 20/59]
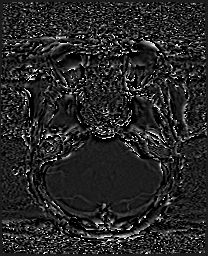
[im 39/59]
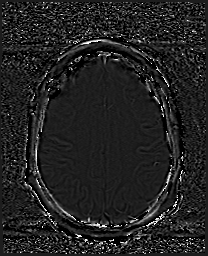
[im 59/59]
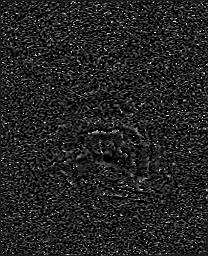

[Series 14: swi_images · axial · 3.0mm · 0.90mm/px · z∈[-124,+53]mm · 4 of 60 slices shown]
[im 1/60]
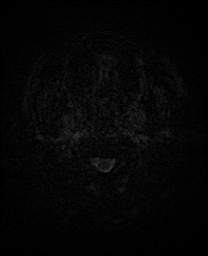
[im 20/60]
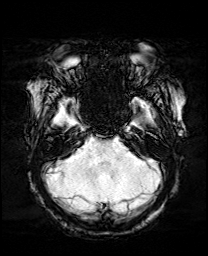
[im 40/60]
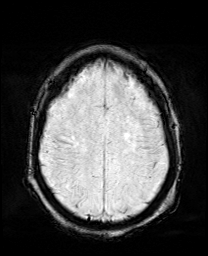
[im 60/60]
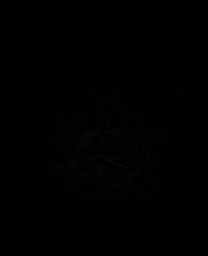

[Series 15: mip_images(sw) · axial · 24.0mm · 0.90mm/px · z∈[-113,+42]mm · 4 of 53 slices shown]
[im 1/53]
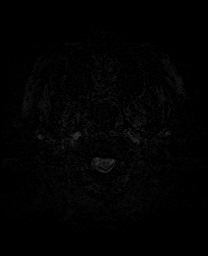
[im 18/53]
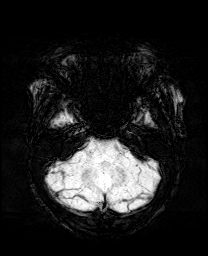
[im 35/53]
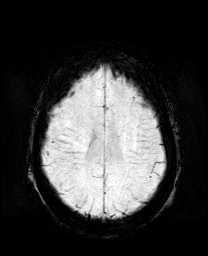
[im 53/53]
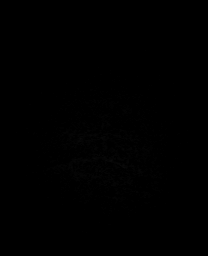

[Series 17: T2 · coronal · 5.0mm · 0.34mm/px · 2 of 30 slices shown (2 of 2)]
[im 1/30]
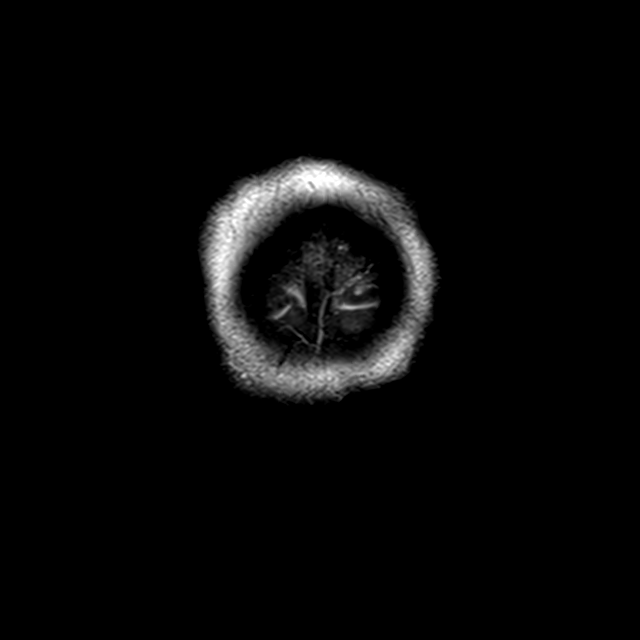
[im 30/30]
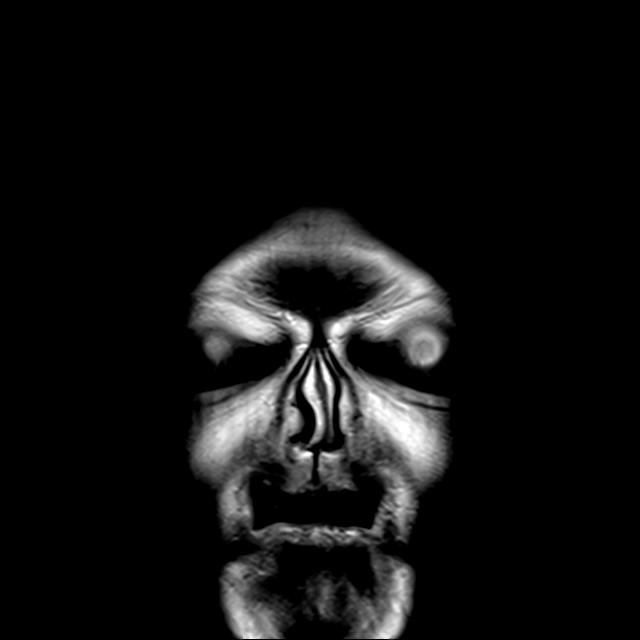

[44 of 48 positions shown; findings below may reference images not displayed]

FINDINGS: Brain: Diffusion imaging shows acute infarction in the right para
median pons. No other acute finding. Chronic small-vessel ischemic
changes are also present in the pons. No focal cerebellar insult.
Cerebral hemispheres show old small vessel infarctions of the
thalami and affecting the cerebral hemispheric deep and subcortical
white matter. No cortical or large vessel territory infarction. No
mass, hemorrhage, hydrocephalus or extra-axial collection.

Vascular: Major vessels at the base of the brain show flow.

Skull and upper cervical spine: Negative

Sinuses/Orbits: Clear/normal

Other: None
IMPRESSION: Small acute infarction in the right para median pons.

Chronic small-vessel ischemic changes of the pons, thalami and
cerebral hemispheric white matter.

## 2021-06-03 MED ORDER — POTASSIUM CHLORIDE CRYS ER 20 MEQ PO TBCR
40.0000 meq | EXTENDED_RELEASE_TABLET | Freq: Once | ORAL | Status: AC
  Administered 2021-06-03: 40 meq via ORAL
  Filled 2021-06-03: qty 2

## 2021-06-03 MED ORDER — ASPIRIN 325 MG PO TABS
325.0000 mg | ORAL_TABLET | Freq: Every day | ORAL | Status: DC
  Administered 2021-06-04: 325 mg via ORAL
  Filled 2021-06-03: qty 1

## 2021-06-03 MED ORDER — ACETAMINOPHEN 160 MG/5ML PO SOLN: 650.0000 mg | ORAL | Status: DC | PRN

## 2021-06-03 MED ORDER — ENOXAPARIN SODIUM 40 MG/0.4ML IJ SOSY
40.0000 mg | PREFILLED_SYRINGE | INTRAMUSCULAR | Status: DC
  Administered 2021-06-03: 40 mg via SUBCUTANEOUS
  Filled 2021-06-03: qty 0.4

## 2021-06-03 MED ORDER — ATORVASTATIN CALCIUM 40 MG PO TABS: 80.0000 mg | ORAL_TABLET | Freq: Every day | ORAL | Status: DC

## 2021-06-03 MED ORDER — SENNOSIDES-DOCUSATE SODIUM 8.6-50 MG PO TABS: 1.0000 | ORAL_TABLET | Freq: Every evening | ORAL | Status: DC | PRN

## 2021-06-03 MED ORDER — STROKE: EARLY STAGES OF RECOVERY BOOK
Freq: Once | Status: AC
  Filled 2021-06-03: qty 1

## 2021-06-03 MED ORDER — SODIUM CHLORIDE 0.9 % IV BOLUS
1000.0000 mL | Freq: Once | INTRAVENOUS | Status: AC
  Administered 2021-06-03: 1000 mL via INTRAVENOUS

## 2021-06-03 MED ORDER — ACETAMINOPHEN 325 MG PO TABS: 650.0000 mg | ORAL_TABLET | ORAL | Status: DC | PRN

## 2021-06-03 MED ORDER — ASPIRIN 325 MG PO TABS
325.0000 mg | ORAL_TABLET | Freq: Once | ORAL | Status: AC
  Administered 2021-06-03: 325 mg via ORAL
  Filled 2021-06-03: qty 1

## 2021-06-03 MED ORDER — POTASSIUM CHLORIDE 20 MEQ PO PACK
40.0000 meq | PACK | Freq: Once | ORAL | Status: AC
  Administered 2021-06-03: 40 meq via ORAL
  Filled 2021-06-03: qty 2

## 2021-06-03 MED ORDER — ACETAMINOPHEN 650 MG RE SUPP: 650.0000 mg | RECTAL | Status: DC | PRN

## 2021-06-03 MED ORDER — ATORVASTATIN CALCIUM 40 MG PO TABS
40.0000 mg | ORAL_TABLET | Freq: Every day | ORAL | Status: DC
  Administered 2021-06-04: 40 mg via ORAL
  Filled 2021-06-03: qty 1

## 2021-06-03 MED ORDER — GADOBUTROL 1 MMOL/ML IV SOLN
9.0000 mL | Freq: Once | INTRAVENOUS | Status: AC | PRN
  Administered 2021-06-03: 9 mL via INTRAVENOUS

## 2021-06-03 MED ORDER — ONDANSETRON HCL 4 MG/2ML IJ SOLN: 4.0000 mg | Freq: Once | INTRAMUSCULAR | Status: DC

## 2021-06-03 NOTE — ED Notes (Signed)
Admitting MD/neurologist notified on patient's bradycardia .

## 2021-06-03 NOTE — ED Notes (Signed)
Patient BIB [facility] as a xfer from [facility]. Patient presented to their ED with left sided numbness. Per patient numbness has improved. Pt NIH upon arrival is a 0. AOx4. VSS.

## 2021-06-03 NOTE — ED Notes (Signed)
Patient transported to MRI 

## 2021-06-03 NOTE — Hospital Course (Addendum)
Stroke left sided CVA - neuro has been consulted.  - left side numbness,  - She states

## 2021-06-03 NOTE — Progress Notes (Addendum)
    Date: 06/03/2021               Patient Name:  Deborah Le MRN: [id number]  DOB: 10/18/1946 Age / Sex: 75 y.o., female   PCP: Guest, Chris W, MD         Medical Service: Internal Medicine Teaching Service         Attending Physician: Dr. Narendra, Nischal, MD    First Contact: Dr. Jinwala Pager: [phone]  Second Contact: Dr. Basaraba Pager: [phone]       After Hours (After 5p/  First Contact Pager: [phone]  weekends / holidays): Second Contact Pager: [phone]   Chief Complaint: Left-sided Numbness  History of Present Illness:   Ms. Siddiqi is a 75 y.o. 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 She first noticed this at 5:05pm yesterday (Saturday) at a family member's funeral. She continues to feel subjective numbness as well as dizziness she describes as a light-headedness, worse upon standing since her numbness started. She denies any weakness, facial droop, changes in speech, CP, palpitations or SOB. She does note a 5-minute episode of bilateral lower abdominal pain that occurred in the ED. She described it as sharp and resolved after she had a bowel movement. She attributed this pain to a "blood clot" although denies any chronic pain with eating, dysuria, hematuria, dark or bloody stools or any other recent changes in bowels. She has mild persistent nausea but no vomiting. She says her heart rate always runs slow and that her blood pressure has been high recently despite not missing any doses of her home medications.   Home Medications: Per Pharmacy Medication Reconcilliation:  Amlodipine 10mg daily  Ferrous sulfate 325mg daily  Levothyroxine 88 mcg daily  Losartan 50mg daily  Myrbetriq 50mg daily  Potassium  Chloride SA 20mEq every other day  Simvastatin 40mg daily   Allergies: Allergies as of 06/03/2021 - Review Complete 06/03/2021  Allergen Reaction Noted  . Penicillins Hives 06/03/2021   Past Medical History:  Diagnosis Date  . History of heart artery stent   . Hypercholesteremia   . Hypertension   . Overactive bladder    Family History:  Patient's father had a CABG.  Denies any known family history of strokes.   Social History:  Patient lives at home with her son.  She has never smoked, denies any history of alcohol use, and has not used any illicit drugs.  She follows with Dr. Sydney Clark (PCP) in [place].   Review of Systems: A complete ROS was negative except as per HPI.   Physical Exam: Blood pressure (!) 158/64, pulse (!) 43, temperature 98.5 F (36.9 C), temperature source Oral, resp. rate 12, height 5' 10" (1.778 m), weight 93.9 kg, SpO2 98 %.  General: Patient is overweight and appears tired although otherwise well in no acute distress.  Eyes: Sclera non-icteric. No conjunctival injection. PERRLA. No nystagmus.  HENT: Neck is supple. No nasal discharge. Respiratory: Lungs are CTA, bilaterally. No wheezes, rales, or rhonchi. Normal work of breathing.  Cardiovascular: Rate is bradycardic to the 40-50's. Rhythm is irregularly irregular. No murmurs, rubs, or gallops. No LE edema. Extremities are warm and well-perfused.  Abdominal: Soft, non-distended, and not tender to palpation. Bowel sounds   intact. No rebound or guarding. Neurological: Awake, alert, and oriented x 4. CN II-XII intact. Sensation is equal and intact to light touch in all four extremities and face. Strength is 5/5 in all four extremities. No dysdiadochokinesia.   MSK: Normal muscle bulk and tone.  Skin: No lesions. No rashes.  Psych: Normal affect. Normal tone of voice.   EKG: personally reviewed my interpretation is sinus bradycardia at a rate of ~52bpm with sinus arrhythmia. Twaves are flattened.  Normal PR and QTc intervals. No ST or T wave changes to suggest acute ischemia.   CT Head without Contrast:   IMPRESSION: Areas of superior frontal and parietal lobe atrophy. Patchy periventricular small vessel disease noted. No acute infarct is appreciable on this study. No mass or hemorrhage.  There are foci of arterial vascular calcification. There is mild ethmoid paranasal sinus disease.  Electronically Signed   By: William  Woodruff III M.D.   On: 06/03/2021 14:11  MR Brain without Contrast:   IMPRESSION: Small acute infarction in the right para median pons.  Chronic small-vessel ischemic changes of the pons, thalami and cerebral hemispheric white matter.  Electronically Signed   By: Mark  Shogry M.D.   On: 06/03/2021 17:53  MRA Head/Neck: Unremarkable   Assessment & Plan by Problem: Active Problems:   CVA (cerebral vascular accident) (HCC)   HTN (hypertension)  # Acute Ischemic CVA  Patient first noticed subjective numbness Saturday 06/02/21 @ 5:05pm associated with nausea and light-headedness, and although symptoms persist currently, her neurological exam is unremarkable. CT head showed areas of superior frontal and parietal lobe atrophy with patchy periventricular small vessel disease and foci of arterial vascular calcification, although no mass or hemorrhage. MRI brain showed a small acute infarction in the right para median pons with chronic small-vessel ischemic changes of the pons, thalami and cerebral hemisphere white matter. MRA head/neck unremarkable. Denies any history of strokes in the past.  - Given ASA 325mg in the ED  - Checking lipid panel, Hgb A1c - Check ECHO  - Pharmacy consulted for home medication reconciliation  - Will adjust home medication as needed and allow permissive HTN for now - Neurology consulted, appreciate their assistance  - Zofran 4mg q6hrs PRN for nausea  - NPO until stroke swallow screen  - Fall precautions  - PT/OT/SLP eval  and treat   # HTN  Blood pressures have increased since admission up to 170's/80's. She takes amlodipine 10mg and losartan 50mg daily at home and says she has not missed any doses recently.  - Patient is outside of permissive HTN window  - Will start amlodipine 5mg daily tomorrow   # HLD Patient takes simvastatin 40mg daily at home. Denies missing doses. Lipid panel shows good control of cholesterol.  - Switched to atorvastatin 40mg daily per neurology's recommendations    # Bradycardia # Sinus Arrhythmia  Telemetry on admission consistent with EKG obtained in the ED which was concerning for symptomatic bradycardia (light-headedness) with irregular rates in the 40-50's. 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 Concerning for sinus nodal dysfunction. May also be in setting of poorly controlled hypothyroidism.  - Continuous telemetry monitoring  - Continuous pulse oximetry  - Monitor and replace electrolytes (goal K > 4, Mg >2)  - Check TSH / free T4  - Consider need for cardiology follow up; Holter monitor vs. 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 U/A was cloudy with amber-colored urine, small Hgb, small leukocytes, and 30 protein although with few bacteria, 21-50 squamous and WBC's. Positive for triple phosphate crystals. She was given 1L NS bolus in the ED. Patient does have a nephrologist and renal function appears to be near her baseline. Despite a hx of frequent UTI, she denies any urinary symptoms and urine sample is not adequate for assessment.  - Continue to monitor daily renal function and electrolytes  - Monitor intake & output   # Hypokalemia, Chronic   # Hypocalcemia  Potassium 3.2, Calcium 8.6, Albumin 4.1. She has been prescribed potassium 20mEq every other day at home. Recent notes suggest  she was on vitamin D replacement; however, this is not on her med list.  - Will give KCl 40mEq PO x 1  - Check magnesium  - Check morning PTH and vitamin D (replace as needed)  - Continue to monitor and replace daily electrolytes as needed  # Overactive Bladder  Denies urinary symptoms at this time.  - Continue Myrbetriq 50mg daily   # Hx of DM Per chart review, patient has a history of DM and was mildly hyperglycemic on presentation. Most recent Hgb A1c was 6.6% at outside facility and it does not appear she is on any glucose-lowering agents at home.  - Started CM/HH diet  - Sensitive SSI + CBG's WC & qHS   # Normocytic Anemia  Patient is on ferrous sulfate daily at home, likely for IDA. Hgb slightly low at 11.9 on repeat measurement.  - Check morning iron studies  - Continue to monitor   # Acute Abdominal Pain  Etiology for lower abdominal pain is unclear. Consider IBS vs. Overactive bladder. She does have a leukocytosis with WBC 15.8, although does not have signs or symptoms of infection or other concerning intra-abdominal pathology at this time.  - Continue to monitor symptoms and morning CBC   # Hx of Hypothyroidism  Patient takes levothyroxine 88mcg daily at home. Denies changes in dosing recently.  - Continue home levothyroxine 88mcg daily  - Check thyroid function tests  Code Status: Full Code Diet: NPO for now  IVF: None DVT PPx: Lovenox   Dispo: Admit patient to Inpatient with expected length of stay greater than 2 midnights.  Signed: Marck Mcclenny, MD 06/03/2021, 10:27 PM  Pager: [phone] After 5pm on weekdays and 1pm on weekends: On Call pager: [phone]   

## 2021-06-03 NOTE — ED Triage Notes (Signed)
Left sided numbness started yesterday while at a funeral , denies any pain.

## 2021-06-03 NOTE — ED Provider Notes (Signed)
Patient presenting from [facility] ED for MRI. She was seen by ED attending Dr. Dalene Seltzer for left-sided numbness involving her left arm, left leg, and the left side of her head that started yesterday.  She had a negative head CT.  Based on her symptoms MRI was warranted.  If MRI is negative patient can be discharged home to follow-up outpatient with neurology.  Physical Exam  BP (!) 178/61 (BP Location: Right Arm)   Pulse (!) 47   Temp 98.5 F (36.9 C) (Oral)   Resp 18   Ht 5\' 10"  (1.778 m)   Wt 93.9 kg   SpO2 98%   BMI 29.70 kg/m   Physical Exam PE: Constitutional: well-developed, well-nourished, no apparent distress HENT: normocephalic, atraumatic. no cervical adenopathy Cardiovascular: normal rate and rhythm, distal pulses intact Pulmonary/Chest: effort normal; breath sounds clear and equal bilaterally; no wheezes or rales Abdominal: soft and nontender Musculoskeletal: full ROM, no edema Neurological: alert with goal directed thinking Skin: warm and dry, no rash, no diaphoresis Psychiatric: normal mood and affect, normal behavior    ED Course/Procedures     Results for orders placed or performed during the hospital encounter of 06/03/21  Resp Panel by RT-PCR (Flu A&B, Covid) Nasopharyngeal Swab   Specimen: Nasopharyngeal Swab; Nasopharyngeal(NP) swabs in vial transport medium  Result Value Ref Range   SARS Coronavirus 2 by RT PCR NEGATIVE NEGATIVE   Influenza A by PCR NEGATIVE NEGATIVE   Influenza B by PCR NEGATIVE NEGATIVE  Ethanol  Result Value Ref Range   Alcohol, Ethyl (B) <10 <10 mg/dL  Protime-INR  Result Value Ref Range   Prothrombin Time 14.3 11.4 - 15.2 seconds   INR 1.1 0.8 - 1.2  APTT  Result Value Ref Range   aPTT 29 24 - 36 seconds  CBC  Result Value Ref Range   WBC 15.8 (H) 4.0 - 10.5 K/uL   RBC 4.59 3.87 - 5.11 MIL/uL   Hemoglobin 12.0 12.0 - 15.0 g/dL   HCT 08/03/21 51.8 - 84.1 %   MCV 83.7 80.0 - 100.0 fL   MCH 26.1 26.0 - 34.0 pg   MCHC 31.3  30.0 - 36.0 g/dL   RDW 66.0 63.0 - 16.0 %   Platelets 275 150 - 400 K/uL   nRBC 0.0 0.0 - 0.2 %  Differential  Result Value Ref Range   Neutrophils Relative % 79 %   Neutro Abs 12.4 (H) 1.7 - 7.7 K/uL   Lymphocytes Relative 14 %   Lymphs Abs 2.2 0.7 - 4.0 K/uL   Monocytes Relative 6 %   Monocytes Absolute 1.0 0.1 - 1.0 K/uL   Eosinophils Relative 1 %   Eosinophils Absolute 0.1 0.0 - 0.5 K/uL   Basophils Relative 0 %   Basophils Absolute 0.0 0.0 - 0.1 K/uL   Immature Granulocytes 0 %   Abs Immature Granulocytes 0.07 0.00 - 0.07 K/uL  Comprehensive metabolic panel  Result Value Ref Range   Sodium 139 135 - 145 mmol/L   Potassium 3.2 (L) 3.5 - 5.1 mmol/L   Chloride 104 98 - 111 mmol/L   CO2 25 22 - 32 mmol/L   Glucose, Bld 125 (H) 70 - 99 mg/dL   BUN 23 8 - 23 mg/dL   Creatinine, Ser 10.9 (H) 0.44 - 1.00 mg/dL   Calcium 8.6 (L) 8.9 - 10.3 mg/dL   Total Protein 8.0 6.5 - 8.1 g/dL   Albumin 4.1 3.5 - 5.0 g/dL   AST 31 15 - 41  U/L   ALT 37 0 - 44 U/L   Alkaline Phosphatase 100 38 - 126 U/L   Total Bilirubin 0.5 0.3 - 1.2 mg/dL   GFR, Estimated 45 (L) >60 mL/min   Anion gap 10 5 - 15  Troponin I (High Sensitivity)  Result Value Ref Range   Troponin I (High Sensitivity) 11 <18 ng/L   CT HEAD WO CONTRAST  Result Date: 06/03/2021 CLINICAL DATA:  Reported recent CVA EXAM: CT HEAD WITHOUT CONTRAST TECHNIQUE: Contiguous axial images were obtained from the base of the skull through the vertex without intravenous contrast. COMPARISON:  None. FINDINGS: Brain: There is symmetric superior frontal and parietal lobe atrophy. There is no intracranial mass, hemorrhage, extra-axial fluid collection, or midline shift. There is patchy decreased attenuation in portions of each centrum semiovale consistent with underlying periventricular small vessel disease. No well-defined acute infarct is evident on this study. Vascular: No hyperdense vessel. There is calcification in each carotid siphon region.  Skull: Bony calvarium appears intact. Sinuses/Orbits: Slight mucosal thickening in several ethmoid air cells. Other visualized paranasal sinuses are clear. Orbits appear symmetric bilaterally. Other: Mastoid air cells clear. IMPRESSION: Areas of superior frontal and parietal lobe atrophy. Patchy periventricular small vessel disease noted. No acute infarct is appreciable on this study. No mass or hemorrhage. There are foci of arterial vascular calcification. There is mild ethmoid paranasal sinus disease. Electronically Signed   By: Bretta Bang III M.D.   On: 06/03/2021 14:11   MR BRAIN WO CONTRAST  Result Date: 06/03/2021 CLINICAL DATA:  Numbness, tingling and paresthesia. EXAM: MRI HEAD WITHOUT CONTRAST TECHNIQUE: Multiplanar, multiecho pulse sequences of the brain and surrounding structures were obtained without intravenous contrast. COMPARISON:  Head CT same day FINDINGS: Brain: Diffusion imaging shows acute infarction in the right para median pons. No other acute finding. Chronic small-vessel ischemic changes are also present in the pons. No focal cerebellar insult. Cerebral hemispheres show old small vessel infarctions of the thalami and affecting the cerebral hemispheric deep and subcortical white matter. No cortical or large vessel territory infarction. No mass, hemorrhage, hydrocephalus or extra-axial collection. Vascular: Major vessels at the base of the brain show flow. Skull and upper cervical spine: Negative Sinuses/Orbits: Clear/normal Other: None IMPRESSION: Small acute infarction in the right para median pons. Chronic small-vessel ischemic changes of the pons, thalami and cerebral hemispheric white matter. Electronically Signed   By: Paulina Fusi M.D.   On: 06/03/2021 17:53   .Critical Care E&M Performed by: Shanon Ace, PA-C  Critical care provider statement:    Critical care time (minutes):  30   Critical care was necessary to treat or prevent imminent or life-threatening  deterioration of the following conditions:  CNS failure or compromise   Critical care was time spent personally by me on the following activities:  Discussions with consultants, examination of patient, re-evaluation of patient's condition, review of old charts and ordering and review of radiographic studies   I assumed direction of critical care for this patient from another provider in my specialty: yes     Care discussed with: admitting provider   After initial E/M assessment, critical care services were subsequently performed that were exclusive of separately billable procedures or treatment.       MDM  Patient received in sign out. Please see previous provider note to include MDM up to this point.    MRI shows small acute infarction in the right para median pons. Discussed results with patient. She denies history of stroke  and is not anticoagulated. She states she is only experiencing left leg numbness at this time. Consulted neuro Dr. Iver Nestle who viewed imaging recommended giving a liter for fluids for her new AKI.  She will also order aspirin and MRA of head and neck.  Patient will need medical admission. Second trop will be collected now as well as UA and UDS.Patient bradycardic to 47, unsure if this is her baseline.  Unassigned admission. Spoke with IM service who agrees to assume care of patient and bring into the hospital for further evaluation and management.     Portions of this note were generated with Scientist, clinical (histocompatibility and immunogenetics). Dictation errors may occur despite best attempts at proofreading.     Shanon Ace, PA-C 06/03/21 Lynelle Smoke    Pricilla Loveless, MD 06/05/21 [phone]

## 2021-06-03 NOTE — ED Notes (Signed)
Pt sts different parts of her LUE and LLE go numb at different times; this has been happening since "four minutes past five" yesterday.,

## 2021-06-03 NOTE — ED Provider Notes (Signed)
[facility] EMERGENCY DEPARTMENT Provider Note   CSN: [id number] Arrival date & time: 06/03/21  1236     History No chief complaint on file.   Deborah Le is a 75 y.o. female.  HPI      75 year old female with a history of hypertension, hyperlipidemia, coronary artery disease presents concern for left-sided numbness.  Reports she was driving home from her sisters funeral when she developed left-sided numbness involving her left arm, left leg, and the left side of her head.  Reports that she went to bed last night, and when she woke up this morning the numbness to the left side of her head and leg had improved, however she had persisting left arm numbness with extension towards her neck.  She denies falls, trauma, headache, neck pain, back pain. Denies  weakness, difficulty talking or walking, visual changes or facial droop.  No chest pain or dyspnea.   Past Medical History:  Diagnosis Date  . History of heart artery stent   . Hypercholesteremia   . Hypertension   . Overactive bladder     There are no problems to display for this patient.   History reviewed. No pertinent surgical history.   OB History   No obstetric history on file.     History reviewed. No pertinent family history.  Social History   Tobacco Use  . Smoking status: Never Smoker  . Smokeless tobacco: Never Used  Substance Use Topics  . Alcohol use: Never  . Drug use: Never    Home Medications Prior to Admission medications   Not on File    Allergies    Penicillins  Review of Systems   Review of Systems  Constitutional: Negative for fever.  HENT: Negative for sore throat.   Eyes: Negative for visual disturbance.  Respiratory: Negative for cough and shortness of breath.   Cardiovascular: Negative for chest pain.  Gastrointestinal: Negative for abdominal pain, nausea and vomiting.  Genitourinary: Negative for difficulty urinating.  Musculoskeletal: Negative for back pain and  neck pain.  Skin: Negative for rash.  Neurological: Positive for numbness. Negative for dizziness, syncope, facial asymmetry, speech difficulty, weakness, light-headedness and headaches.    Physical Exam Updated Vital Signs BP (!) 158/66   Pulse (!) 47   Temp 98.2 F (36.8 C) (Oral)   Resp 14   Ht 5\' 10"  (1.778 m)   Wt 93.9 kg   SpO2 98%   BMI 29.70 kg/m   Physical Exam Vitals and nursing note reviewed.  Constitutional:      General: She is not in acute distress.    Appearance: She is well-developed. She is not diaphoretic.  HENT:     Head: Normocephalic and atraumatic.  Eyes:     General: No visual field deficit.    Conjunctiva/sclera: Conjunctivae normal.  Cardiovascular:     Rate and Rhythm: Normal rate and regular rhythm.     Heart sounds: Normal heart sounds. No murmur heard. No friction rub. No gallop.   Pulmonary:     Effort: Pulmonary effort is normal. No respiratory distress.     Breath sounds: Normal breath sounds. No wheezing or rales.  Abdominal:     General: There is no distension.     Palpations: Abdomen is soft.     Tenderness: There is no abdominal tenderness. There is no guarding.  Musculoskeletal:        General: No tenderness.     Cervical back: Normal range of motion.  Skin:  General: Skin is warm and dry.     Findings: No erythema or rash.  Neurological:     Mental Status: She is alert and oriented to person, place, and time.     GCS: GCS eye subscore is 4. GCS verbal subscore is 5. GCS motor subscore is 6.     Cranial Nerves: Cranial nerves are intact. No cranial nerve deficit, dysarthria or facial asymmetry.     Sensory: Sensory deficit present.     Motor: No weakness or pronator drift.     Coordination: Coordination is intact. Finger-Nose-Finger Test normal.     Comments: Reports numbness to proximal arm     ED Results / Procedures / Treatments   Labs (all labs ordered are listed, but only abnormal results are displayed) Labs  Reviewed  CBC - Abnormal; Notable for the following components:      Result Value   WBC 15.8 (*)    All other components within normal limits  DIFFERENTIAL - Abnormal; Notable for the following components:   Neutro Abs 12.4 (*)    All other components within normal limits  COMPREHENSIVE METABOLIC PANEL - Abnormal; Notable for the following components:   Potassium 3.2 (*)    Glucose, Bld 125 (*)    Creatinine, Ser 1.26 (*)    Calcium 8.6 (*)    GFR, Estimated 45 (*)    All other components within normal limits  ETHANOL  PROTIME-INR  APTT  URINALYSIS, ROUTINE W REFLEX MICROSCOPIC  TROPONIN I (HIGH SENSITIVITY)    EKG EKG Interpretation  Date/Time:  Sunday June 03 2021 12:52:32 EDT Ventricular Rate:  52 PR Interval:  172 QRS Duration: 92 QT Interval:  456 QTC Calculation: 424 R Axis:   -4 Text Interpretation: Sinus bradycardia with sinus arrhythmia Nonspecific ST and T wave abnormality Abnormal ECG No previous ECGs available Confirmed by Alvira Monday ([id number]) on 06/03/2021 1:21:42 PM   Radiology CT HEAD WO CONTRAST  Result Date: 06/03/2021 CLINICAL DATA:  Reported recent CVA EXAM: CT HEAD WITHOUT CONTRAST TECHNIQUE: Contiguous axial images were obtained from the base of the skull through the vertex without intravenous contrast. COMPARISON:  None. FINDINGS: Brain: There is symmetric superior frontal and parietal lobe atrophy. There is no intracranial mass, hemorrhage, extra-axial fluid collection, or midline shift. There is patchy decreased attenuation in portions of each centrum semiovale consistent with underlying periventricular small vessel disease. No well-defined acute infarct is evident on this study. Vascular: No hyperdense vessel. There is calcification in each carotid siphon region. Skull: Bony calvarium appears intact. Sinuses/Orbits: Slight mucosal thickening in several ethmoid air cells. Other visualized paranasal sinuses are clear. Orbits appear symmetric bilaterally.  Other: Mastoid air cells clear. IMPRESSION: Areas of superior frontal and parietal lobe atrophy. Patchy periventricular small vessel disease noted. No acute infarct is appreciable on this study. No mass or hemorrhage. There are foci of arterial vascular calcification. There is mild ethmoid paranasal sinus disease. Electronically Signed   By: Bretta Bang III M.D.   On: 06/03/2021 14:11    Procedures Procedures   Medications Ordered in ED Medications  potassium chloride SA (KLOR-CON) CR tablet 40 mEq (has no administration in time range)    ED Course  I have reviewed the triage vital signs and the nursing notes.  Pertinent labs & imaging results that were available during my care of the patient were reviewed by me and considered in my medical decision making (see chart for details).    MDM Rules/Calculators/A&P  75 year old female with a history of hypertension, hyperlipidemia, coronary artery disease presents concern for left-sided numbness which developed at 5PM yesterday on the way back from her sister's funeral.  Differential diagnosis includes electrolyte abnormality, TIA, CVA, stress reaction, ACS.  EKG without acute findings, denies chest pain or shortness of breath.  CT head shows patchy periventricular small vessel disease without acute infarct or hemorrhage.  Labs without significant electrolyte abnormalities, mild hypokalemia.  Denies back or neck pain to suggest these as etiology of symptoms.  Describes improved numbness of the lower extremity and side of the head, but does have consistent numbness of the left upper extremity, and will transfer to [facility] for MRI.  Given duration of this numbness and in the setting of grief, feel if MRI is negative and there are no signs of acute stroke, she may follow up as outpatient.   Transferring to [facility] for MRI brain to evaluate for CVA. Troponin pending at time of transfer of care.    Final Clinical  Impression(s) / ED Diagnoses Final diagnoses:  Numbness and tingling    Rx / DC Orders ED Discharge Orders    None       Alvira Monday, MD 06/03/21 [phone]

## 2021-06-03 NOTE — ED Notes (Signed)
Patient's family notified on patient's admission/plan of care .

## 2021-06-03 NOTE — Consult Note (Signed)
NEURO HOSPITALIST CONSULT NOTE   Requestig physician: Dr. Regenia Skeeter  Reason for Consult: Acute right pontine lacunar infarction  History obtained from:  Patient and Chart     HPI:                                                                                                                                          Deborah Le is an 75 y.o. female with a PMHx of HTN, HLD and CAD s/p stenting who presented to the [facility] ED this afternoon with left sided numbness involving the arm, leg and side of her head. The symptoms began on Saturday at about 5 PM while driving home from her sister's funeral. She went to sleep and on awakening this morning the symptoms involving her left leg and the left side of her head had improved, but she continued to have left arm numbness with radiation to her neck. CT showed patchy periventricular small vessel disease without acute infarct or hemorrhage. She was then transferred to [facility], where MRI brain revealed an acute right pontine lacunar infarction.   MRI brain:  Small acute infarction in the right para median pons. Chronic small-vessel ischemic changes of the pons, thalami and cerebral hemispheric white matter.  EKG: Sinus bradycardia with sinus arrhythmia. Nonspecific ST and T wave abnormality.  Labs: BUN 23, Cr 1.26, eGFR 45 WBC 15.8    Past Medical History:  Diagnosis Date  . History of heart artery stent   . Hypercholesteremia   . Hypertension   . Overactive bladder     History reviewed. No pertinent surgical history.  History reviewed. No pertinent family history.            Social History:  reports that she has never smoked. She has never used smokeless tobacco. She reports that she does not drink alcohol and does not use drugs.  Allergies  Allergen Reactions  . Penicillins      Home Medications:   Scheduled Inpatient Medications: ASA 325 mg po qd Atorvastatin 80 mg po qd Lovenox 40 mg SQ  q24h Tylenol PRN   ROS:  Denies limb weakness, speech deficit, visual deficit, facial droop or difficulty ambulating. She also has no SOB or CP. No recent trauma. No headache or neck pain.     Blood pressure (!) 178/61, pulse (!) 47, temperature 98.5 F (36.9 C), temperature source Oral, resp. rate 18, height 5' 10"  (1.778 m), weight 93.9 kg, SpO2 98 %.   General Examination:                                                                                                       HEENT: [place]/AT Lungs: Respirations unlabored Ext: No edema  Neurological Examination Mental Status: Awake and alert. Oriented x 5. No dysarthria. Speech fluent without evidence of aphasia.  Able to follow all commands without difficulty. Cranial Nerves: II: Temporal visual fields intact with no extinction to DSS.  III,IV, VI: No ptosis. EOMI. No nystagmus.   V,VII: Smile symmetric, facial temp sensation equal bilaterally VIII: hearing intact to conversation IX,X: No hypophonia XI: Symmetric shoulder shrug XII: Midline tongue extension Motor: Right : Upper extremity   5/5    Left:     Upper extremity   5/5  Lower extremity   5/5     Lower extremity   5/5 No asymmetry of lower extremity strength proximally and distally No pronator drift Sensory: Temp and light touch intact in BUE and BLE. No extinction to DSS.  Deep Tendon Reflexes: 2+ and symmetric bilateral brachioradialis, biceps and patellae  Plantars: Right: downgoing  Left: downgoing Cerebellar: No ataxia with FNF bilaterally  Gait: Deferred   Lab Results: Basic Metabolic Panel: Recent Labs  Lab 06/03/21 1420  NA 139  K 3.2*  CL 104  CO2 25  GLUCOSE 125*  BUN 23  CREATININE 1.26*  CALCIUM 8.6*    CBC: Recent Labs  Lab 06/03/21 1420  WBC 15.8*  NEUTROABS 12.4*  HGB 12.0  HCT 38.4  MCV 83.7  PLT 275     Cardiac Enzymes: No results for input(s): CKTOTAL, CKMB, CKMBINDEX, TROPONINI in the last 168 hours.  Lipid Panel: No results for input(s): CHOL, TRIG, HDL, CHOLHDL, VLDL, LDLCALC in the last 168 hours.  Imaging: CT HEAD WO CONTRAST  Result Date: 06/03/2021 CLINICAL DATA:  Reported recent CVA EXAM: CT HEAD WITHOUT CONTRAST TECHNIQUE: Contiguous axial images were obtained from the base of the skull through the vertex without intravenous contrast. COMPARISON:  None. FINDINGS: Brain: There is symmetric superior frontal and parietal lobe atrophy. There is no intracranial mass, hemorrhage, extra-axial fluid collection, or midline shift. There is patchy decreased attenuation in portions of each centrum semiovale consistent with underlying periventricular small vessel disease. No well-defined acute infarct is evident on this study. Vascular: No hyperdense vessel. There is calcification in each carotid siphon region. Skull: Bony calvarium appears intact. Sinuses/Orbits: Slight mucosal thickening in several ethmoid air cells. Other visualized paranasal sinuses are clear. Orbits appear symmetric bilaterally. Other: Mastoid air cells clear. IMPRESSION: Areas of superior frontal and parietal lobe atrophy. Patchy periventricular small vessel disease noted. No acute infarct is appreciable on this study. No mass or hemorrhage. There  are foci of arterial vascular calcification. There is mild ethmoid paranasal sinus disease. Electronically Signed   By: Lowella Grip III M.D.   On: 06/03/2021 14:11   MR BRAIN WO CONTRAST  Result Date: 06/03/2021 CLINICAL DATA:  Numbness, tingling and paresthesia. EXAM: MRI HEAD WITHOUT CONTRAST TECHNIQUE: Multiplanar, multiecho pulse sequences of the brain and surrounding structures were obtained without intravenous contrast. COMPARISON:  Head CT same day FINDINGS: Brain: Diffusion imaging shows acute infarction in the right para median pons. No other acute finding. Chronic  small-vessel ischemic changes are also present in the pons. No focal cerebellar insult. Cerebral hemispheres show old small vessel infarctions of the thalami and affecting the cerebral hemispheric deep and subcortical white matter. No cortical or large vessel territory infarction. No mass, hemorrhage, hydrocephalus or extra-axial collection. Vascular: Major vessels at the base of the brain show flow. Skull and upper cervical spine: Negative Sinuses/Orbits: Clear/normal Other: None IMPRESSION: Small acute infarction in the right para median pons. Chronic small-vessel ischemic changes of the pons, thalami and cerebral hemispheric white matter. Electronically Signed   By: Nelson Chimes M.D.   On: 06/03/2021 17:53    Assessment: 75 year old female presenting with left sided numbness. MRI reveals a small right pontine acute lacunar infarction 1. Exam reveals no focal deficits.  2. MRI brain: Small acute infarction in the right para median pons. Chronic small-vessel ischemic changes of the pons, thalami and cerebral hemispheric white matter. 3. HIstory of cardiac stent.  4. States that she currently takes ASA every other day. 5. Normal MRA of the head and neck.  6. Stroke risk factors: HTN, HLD and CAD  Recommendations: 1. HgbA1c, fasting lipid panel 2. TTE 3. Cardiac telemetry 4. Increase frequency of home ASA to QD dosing.  5. Decreasing atorvastatin to 40 mg po qd due to the patients age (medium-dose atorvastatin in patients 20 or older). Obtain baseline CK level. 6. PT/OT/Speech 7. BP management. Out of the permissive HTN time window. Goal is 120/80.   8. Risk factor modification 9. Frequent neuro checks 10. NPO until passes stroke swallow screen   Electronically signed: Dr. Kerney Elbe 06/03/2021, 7:14 PM

## 2021-06-04 ENCOUNTER — Encounter (HOSPITAL_COMMUNITY): Payer: Medicare Other

## 2021-06-04 ENCOUNTER — Inpatient Hospital Stay (HOSPITAL_BASED_OUTPATIENT_CLINIC_OR_DEPARTMENT_OTHER): Payer: Medicare Other

## 2021-06-04 DIAGNOSIS — I639 Cerebral infarction, unspecified: Secondary | ICD-10-CM

## 2021-06-04 DIAGNOSIS — I6389 Other cerebral infarction: Secondary | ICD-10-CM

## 2021-06-04 DIAGNOSIS — N1831 Chronic kidney disease, stage 3a: Secondary | ICD-10-CM

## 2021-06-04 DIAGNOSIS — R001 Bradycardia, unspecified: Secondary | ICD-10-CM | POA: Diagnosis not present

## 2021-06-04 DIAGNOSIS — I10 Essential (primary) hypertension: Secondary | ICD-10-CM | POA: Diagnosis not present

## 2021-06-04 DIAGNOSIS — E039 Hypothyroidism, unspecified: Secondary | ICD-10-CM

## 2021-06-04 DIAGNOSIS — R42 Dizziness and giddiness: Secondary | ICD-10-CM | POA: Diagnosis not present

## 2021-06-04 DIAGNOSIS — I63219 Cerebral infarction due to unspecified occlusion or stenosis of unspecified vertebral arteries: Secondary | ICD-10-CM | POA: Diagnosis not present

## 2021-06-04 DIAGNOSIS — E785 Hyperlipidemia, unspecified: Secondary | ICD-10-CM

## 2021-06-04 DIAGNOSIS — N3281 Overactive bladder: Secondary | ICD-10-CM

## 2021-06-04 LAB — HEMOGLOBIN A1C
Hgb A1c MFr Bld: 6.4 % — ABNORMAL HIGH (ref 4.8–5.6)
Mean Plasma Glucose: 137 mg/dL

## 2021-06-04 LAB — VITAMIN D 25 HYDROXY (VIT D DEFICIENCY, FRACTURES): Vit D, 25-Hydroxy: 11.89 ng/mL — ABNORMAL LOW (ref 30–100)

## 2021-06-04 LAB — IRON AND TIBC
Iron: 38 ug/dL (ref 28–170)
Saturation Ratios: 15 % (ref 10.4–31.8)
TIBC: 255 ug/dL (ref 250–450)
UIBC: 217 ug/dL

## 2021-06-04 LAB — ECHOCARDIOGRAM COMPLETE
Area-P 1/2: 2.44 cm2
Height: 70 in
S' Lateral: 3.5 cm
Weight: 3312 oz

## 2021-06-04 LAB — CBG MONITORING, ED
Glucose-Capillary: 119 mg/dL — ABNORMAL HIGH (ref 70–99)
Glucose-Capillary: 144 mg/dL — ABNORMAL HIGH (ref 70–99)

## 2021-06-04 LAB — COMPREHENSIVE METABOLIC PANEL
ALT: 32 U/L (ref 0–44)
AST: 28 U/L (ref 15–41)
Albumin: 3.3 g/dL — ABNORMAL LOW (ref 3.5–5.0)
Alkaline Phosphatase: 83 U/L (ref 38–126)
Anion gap: 8 (ref 5–15)
BUN: 17 mg/dL (ref 8–23)
CO2: 24 mmol/L (ref 22–32)
Calcium: 8.2 mg/dL — ABNORMAL LOW (ref 8.9–10.3)
Chloride: 107 mmol/L (ref 98–111)
Creatinine, Ser: 1.29 mg/dL — ABNORMAL HIGH (ref 0.44–1.00)
GFR, Estimated: 43 mL/min — ABNORMAL LOW (ref >60)
Glucose, Bld: 147 mg/dL — ABNORMAL HIGH (ref 70–99)
Potassium: 4 mmol/L (ref 3.5–5.1)
Sodium: 139 mmol/L (ref 135–145)
Total Bilirubin: 0.7 mg/dL (ref 0.3–1.2)
Total Protein: 6.6 g/dL (ref 6.5–8.1)

## 2021-06-04 LAB — FERRITIN: Ferritin: 115 ng/mL (ref 11–307)

## 2021-06-04 LAB — CBC
HCT: 38.5 % (ref 36.0–46.0)
Hemoglobin: 11.5 g/dL — ABNORMAL LOW (ref 12.0–15.0)
MCH: 25.9 pg — ABNORMAL LOW (ref 26.0–34.0)
MCHC: 29.9 g/dL — ABNORMAL LOW (ref 30.0–36.0)
MCV: 86.7 fL (ref 80.0–100.0)
Platelets: 292 10*3/uL (ref 150–400)
RBC: 4.44 MIL/uL (ref 3.87–5.11)
RDW: 15.3 % (ref 11.5–15.5)
WBC: 9.7 10*3/uL (ref 4.0–10.5)
nRBC: 0 % (ref 0.0–0.2)

## 2021-06-04 MED ORDER — ASPIRIN EC 81 MG PO TBEC
81.0000 mg | DELAYED_RELEASE_TABLET | Freq: Every day | ORAL | Status: DC
  Administered 2021-06-04: 81 mg via ORAL
  Filled 2021-06-04: qty 1

## 2021-06-04 MED ORDER — ASPIRIN 81 MG PO TBEC: 81.0000 mg | DELAYED_RELEASE_TABLET | Freq: Every day | ORAL | 0 refills | Status: AC

## 2021-06-04 MED ORDER — CLOPIDOGREL BISULFATE 75 MG PO TABS: 75.0000 mg | ORAL_TABLET | Freq: Every day | ORAL | 1 refills | Status: AC

## 2021-06-04 MED ORDER — AMLODIPINE BESYLATE 5 MG PO TABS: 5.0000 mg | ORAL_TABLET | Freq: Every day | ORAL | Status: DC

## 2021-06-04 MED ORDER — CLOPIDOGREL BISULFATE 75 MG PO TABS
75.0000 mg | ORAL_TABLET | Freq: Every day | ORAL | Status: DC
  Administered 2021-06-04: 75 mg via ORAL
  Filled 2021-06-04: qty 1

## 2021-06-04 MED ORDER — INSULIN ASPART 100 UNIT/ML IJ SOLN
0.0000 [IU] | Freq: Three times a day (TID) | INTRAMUSCULAR | Status: DC
  Administered 2021-06-04: 1 [IU] via SUBCUTANEOUS

## 2021-06-04 NOTE — Progress Notes (Signed)
  Echocardiogram 2D Echocardiogram has been performed.  Deborah Le 06/04/2021, 9:36 AM

## 2021-06-04 NOTE — H&P (Signed)
Date: 06/03/2021               Patient Name:  Deborah Le MRN: [id number]  DOB: [birth year] Age / Sex: 75 y.o., female   PCP: Jonita Albee, MD         Medical Service: Internal Medicine Teaching Service         Attending Physician: Dr. Earl Lagos, MD    First Contact: Dr. Austin Miles Pager: [phone]  Second Contact: Dr. Huel Cote Pager: [phone]       After Hours (After 5p/  First Contact Pager: [phone]  weekends / holidays): Second Contact Pager: [phone]   Chief Complaint: Left-sided Numbness  History of Present Illness:   Ms. Bame is a 75 y.o. 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 She first noticed this at 5:05pm yesterday (Saturday) at a family member's funeral. She continues to feel subjective numbness as well as dizziness she describes as a light-headedness, worse upon standing since her numbness started. She denies any weakness, facial droop, changes in speech, CP, palpitations or SOB. She does note a 5-minute episode of bilateral lower abdominal pain that occurred in the ED. She described it as sharp and resolved after she had a bowel movement. She attributed this pain to a "blood clot" although denies any chronic pain with eating, dysuria, hematuria, dark or bloody stools or any other recent changes in bowels. She has mild persistent nausea but no vomiting. She says her heart rate always runs slow and that her blood pressure has been high recently despite not missing any doses of her home medications.   Home Medications: Per Pharmacy Medication Reconcilliation:  Amlodipine 10mg  daily  Ferrous sulfate 325mg  daily  Levothyroxine 88 mcg daily  Losartan 50mg  daily  Myrbetriq 50mg  daily  Potassium  Chloride SA every other day  Simvastatin 40mg  daily   Allergies: Allergies as of 06/03/2021 - Review Complete 06/03/2021  Allergen Reaction Noted  . Penicillins Hives 06/03/2021   Past Medical History:  Diagnosis Date  . History of heart artery stent   . Hypercholesteremia   . Hypertension   . Overactive bladder    Family History:  Patient's father had a CABG.  Denies any known family history of strokes.   Social History:  Patient lives at home with her son.  She has never smoked, denies any history of alcohol use, and has not used any illicit drugs.  She follows with Dr. (PCP) in [facility].   Review of Systems: A complete ROS was negative except as per HPI.   Physical Exam: Blood pressure (!) 158/64, pulse (!) 43, temperature 98.5 F (36.9 C), temperature source Oral, resp. rate 12, height 5\' 10"  (1.778 m), weight 93.9 kg, SpO2 98 %.  General: Patient is overweight and appears tired although otherwise well in no acute distress.  Eyes: Sclera non-icteric. No conjunctival injection. PERRLA. No nystagmus.  HENT: Neck is supple. No nasal discharge. Respiratory: Lungs are CTA, bilaterally. No wheezes, rales, or rhonchi. Normal work of breathing.  Cardiovascular: Rate is bradycardic to the 40-50's. Rhythm is irregularly irregular. No murmurs, rubs, or gallops. No LE edema. Extremities are warm and well-perfused.  Abdominal: Soft, non-distended, and not tender to palpation. Bowel sounds  intact. No rebound or guarding. Neurological: Awake, alert, and oriented x 4. CN II-XII intact. Sensation is equal and intact to light touch in all four extremities and face. Strength is 5/5 in all four extremities. No dysdiadochokinesia.   MSK: Normal muscle bulk and tone.  Skin: No lesions. No rashes.  Psych: Normal affect. Normal tone of voice.   EKG: personally reviewed my interpretation is sinus bradycardia at a rate of ~52bpm with sinus arrhythmia. Twaves are flattened.  Normal PR and QTc intervals. No ST or T wave changes to suggest acute ischemia.   CT Head without Contrast:   IMPRESSION: Areas of superior frontal and parietal lobe atrophy. Patchy periventricular small vessel disease noted. No acute infarct is appreciable on this study. No mass or hemorrhage.  There are foci of arterial vascular calcification. There is mild ethmoid paranasal sinus disease.  Electronically Signed   By: Bretta Bang III M.D.   On: 06/03/2021 14:11  MR Brain without Contrast:   IMPRESSION: Small acute infarction in the right para median pons.  Chronic small-vessel ischemic changes of the pons, thalami and cerebral hemispheric white matter.  Electronically Signed   By: Paulina Fusi M.D.   On: 06/03/2021 17:53  MRA Head/Neck: Unremarkable   Assessment & Plan by Problem: Active Problems:   CVA (cerebral vascular accident) (HCC)   HTN (hypertension)  # Acute Ischemic CVA  Patient first noticed subjective numbness Saturday 06/02/21 @ 5:05pm associated with nausea and light-headedness, and although symptoms persist currently, her neurological exam is unremarkable. CT head showed areas of superior frontal and parietal lobe atrophy with patchy periventricular small vessel disease and foci of arterial vascular calcification, although no mass or hemorrhage. MRI brain showed a small acute infarction in the right para median pons with chronic small-vessel ischemic changes of the pons, thalami and cerebral hemisphere white matter. MRA head/neck unremarkable. Denies any history of strokes in the past.  - Given ASA 325mg  in the ED  - Checking lipid panel, Hgb A1c - Check ECHO  - Pharmacy consulted for home medication reconciliation  - Will adjust home medication as needed and allow permissive HTN for now - Neurology consulted, appreciate their assistance  - Zofran 4mg  q6hrs PRN for nausea  - NPO until stroke swallow screen  - Fall precautions  - PT/OT/SLP eval  and treat   # HTN  Blood pressures have increased since admission up to 170's/80's. She takes amlodipine 10mg  and losartan 50mg  daily at home and says she has not missed any doses recently.  - Patient is outside of permissive HTN window  - Will start amlodipine 5mg  daily tomorrow   # HLD Patient takes simvastatin 40mg  daily at home. Denies missing doses. Lipid panel shows good control of cholesterol.  - Switched to atorvastatin 40mg  daily per neurology's recommendations    # Bradycardia # Sinus Arrhythmia  Telemetry on admission consistent with EKG obtained in the ED which was concerning for symptomatic bradycardia (light-headedness) with irregular rates in the 40-50's. 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 Concerning for sinus nodal dysfunction. May also be in setting of poorly controlled hypothyroidism.  - Continuous telemetry monitoring  - Continuous pulse oximetry  - Monitor and replace electrolytes (goal K > 4, Mg >2)  - Check TSH / free T4  - Consider need for cardiology follow up; Holter monitor vs. 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 U/A was cloudy with amber-colored urine, small Hgb, small leukocytes, and 30 protein although with few bacteria, 21-50 squamous and WBC's. Positive for triple phosphate crystals. She was given 1L NS bolus in the ED. Patient does have a nephrologist and renal function appears to be near her baseline. Despite a hx of frequent UTI, she denies any urinary symptoms and urine sample is not adequate for assessment.  - Continue to monitor daily renal function and electrolytes  - Monitor intake & output   # Hypokalemia, Chronic   # Hypocalcemia  Potassium 3.2, Calcium 8.6, Albumin 4.1. She has been prescribed potassium every other day at home. Recent notes suggest  she was on vitamin D replacement; however, this is not on her med list.  - Will give KCl PO x 1  - Check magnesium  - Check morning PTH and vitamin D (replace as needed)  - Continue to monitor and replace daily electrolytes as needed  # Overactive Bladder  Denies urinary symptoms at this time.  - Continue Myrbetriq 50mg  daily   # Hx of DM Per chart review, patient has a history of DM and was mildly hyperglycemic on presentation. Most recent Hgb A1c was 6.6% at outside facility and it does not appear she is on any glucose-lowering agents at home.  - Started CM/HH diet  - Sensitive SSI + CBG's WC & qHS   # Normocytic Anemia  Patient is on ferrous sulfate daily at home, likely for IDA. Hgb slightly low at 11.9 on repeat measurement.  - Check morning iron studies  - Continue to monitor   # Acute Abdominal Pain  Etiology for lower abdominal pain is unclear. Consider IBS vs. Overactive bladder. She does have a leukocytosis with WBC 15.8, although does not have signs or symptoms of infection or other concerning intra-abdominal pathology at this time.  - Continue to monitor symptoms and morning CBC   # Hx of Hypothyroidism  Patient takes levothyroxine daily at home. Denies changes in dosing recently.  - Continue home levothyroxine daily  - Check thyroid function tests  Code Status: Full Code Diet: NPO for now  IVF: None DVT PPx: Lovenox   Dispo: Admit patient to Inpatient with expected length of stay greater than 2 midnights.  Signed: , MD 06/03/2021, 10:27 PM  Pager: [phone] After 5pm on weekdays and 1pm on weekends: On Call pager: [phone]

## 2021-06-04 NOTE — ED Notes (Signed)
Informed RN and Tech of continued [place]; staff checked on patient who was okay.

## 2021-06-04 NOTE — Care Management Obs Status (Signed)
MEDICARE OBSERVATION STATUS NOTIFICATION   Patient Details  Name: Deborah Le MRN: [id number] Date of Birth: [birth year]   Medicare Observation Status Notification Given:       Michel Bickers, RN 06/04/2021, 4:17 PM

## 2021-06-04 NOTE — ED Notes (Signed)
Assisted patient to bathroom ambulates well with cane

## 2021-06-04 NOTE — Evaluation (Signed)
Physical Therapy Evaluation Patient Details Name: Deborah Le MRN: [id number] DOB: [birth year] Today's Date: 06/04/2021   History of Present Illness  Pt is a 75 y/o female admitted 6/5 secondary to L arm and leg numbness. MRI revelaed infarct in R paramedian pons. PMH includes HTN, CAD, overactive bladder,  DM, stage 3a CKD s/p nephrectomy.  Clinical Impression  Pt admitted secondary to problem above with deficits below. Pt requiring min guard A for safety to ambulate with cane. Mild balance deficits noted with gait on a level surface and reporting numbness/tingling in LLE/LUE. Feel she would benefit from outpatient PT follow up to address higher level balance deficits. Will continue to follow acutely.     Follow Up Recommendations Outpatient PT    Equipment Recommendations  None recommended by PT    Recommendations for Other Services       Precautions / Restrictions Precautions Precautions: Fall Restrictions Weight Bearing Restrictions: No      Mobility  Bed Mobility Overal bed mobility: Needs Assistance Bed Mobility: Supine to Sit;Sit to Supine     Supine to sit: Supervision Sit to supine: Supervision   General bed mobility comments: Supervision for safety    Transfers Overall transfer level: Needs assistance Equipment used: Straight cane Transfers: Sit to/from Stand Sit to Stand: Min guard         General transfer comment: Min guard A for safety to stand from stretcher  Ambulation/Gait Ambulation/Gait assistance: Min guard Gait Distance (Feet): 125 Feet Assistive device: Straight cane Gait Pattern/deviations: Step-through pattern;Decreased stride length Gait velocity: Decreased   General Gait Details: Mild unsteadiness noted. Pt reporting decreased sensation in LLE and tingling feeling throughout during mobility. Min guard for safety.  Stairs            Wheelchair Mobility    Modified Rankin (Stroke Patients Only) Modified Rankin (Stroke  Patients Only) Pre-Morbid Rankin Score: Slight disability Modified Rankin: Moderately severe disability     Balance Overall balance assessment: Needs assistance Sitting-balance support: No upper extremity supported;Feet supported Sitting balance-Leahy Scale: Good     Standing balance support: Single extremity supported;No upper extremity supported;During functional activity Standing balance-Leahy Scale: Fair Standing balance comment: Able to static stand at sink without UE support                             Pertinent Vitals/Pain Pain Assessment: No/denies pain    Home Living Family/patient expects to be discharged to:: Private residence Living Arrangements: Children Available Help at Discharge: Family;Available PRN/intermittently Type of Home: House Home Access: Level entry     Home Layout: Two level Home Equipment: Cane - single point      Prior Function Level of Independence: Independent with assistive device(s)         Comments: USed cane for ambulation     Hand Dominance        Extremity/Trunk Assessment   Upper Extremity Assessment Upper Extremity Assessment: Defer to OT evaluation    Lower Extremity Assessment Lower Extremity Assessment: LLE deficits/detail LLE Deficits / Details: Reporting tingling sensation throughout LLE    Cervical / Trunk Assessment Cervical / Trunk Assessment: Normal  Communication   Communication: No difficulties  Cognition Arousal/Alertness: Awake/alert Behavior During Therapy: WFL for tasks assessed/performed Overall Cognitive Status: Within Functional Limits for tasks assessed  General Comments      Exercises     Assessment/Plan    PT Assessment Patient needs continued PT services  PT Problem List Decreased strength;Decreased balance;Decreased mobility;Decreased knowledge of use of DME;Impaired sensation       PT Treatment Interventions DME  instruction;Stair training;Gait training;Functional mobility training;Therapeutic exercise;Therapeutic activities;Balance training;Patient/family education    PT Goals (Current goals can be found in the Care Plan section)  Acute Rehab PT Goals Patient Stated Goal: to go home PT Goal Formulation: With patient Time For Goal Achievement: 06/18/21 Potential to Achieve Goals: Fair    Frequency Min 4X/week   Barriers to discharge        Co-evaluation               AM-PAC PT "6 Clicks" Mobility  Outcome Measure Help needed turning from your back to your side while in a flat bed without using bedrails?: None Help needed moving from lying on your back to sitting on the side of a flat bed without using bedrails?: A Little Help needed moving to and from a bed to a chair (including a wheelchair)?: A Little Help needed standing up from a chair using your arms (e.g., wheelchair or bedside chair)?: A Little Help needed to walk in hospital room?: A Little Help needed climbing 3-5 steps with a railing? : A Little 6 Click Score: 19    End of Session Equipment Utilized During Treatment: Gait belt Activity Tolerance: Patient tolerated treatment well Patient left: in bed;with call bell/phone within reach (on stretcher in ED) Nurse Communication: Mobility status PT Visit Diagnosis: Unsteadiness on feet (R26.81);Muscle weakness (generalized) (M62.81)    Time: 8469-6295 PT Time Calculation (min) (ACUTE ONLY): 23 min   Charges:   PT Evaluation $PT Eval Low Complexity: 1 Low PT Treatments $Gait Training: 8-22 mins        Cindee Salt, DPT  Acute Rehabilitation Services  Pager: [phone] Office: [phone]   Deborah Le 06/04/2021, 2:54 PM

## 2021-06-04 NOTE — Discharge Planning (Signed)
RNCM placed order for Outpatient Rehab as requested.  Office will contact pt to schedule appointment.

## 2021-06-04 NOTE — Progress Notes (Addendum)
STROKE TEAM PROGRESS NOTE   INTERVAL HISTORY She is sitting up to chair in ER room, awaiting a bed up stairs. Stroke work up underway. She reports taking ASA only every other day d/t stomach upset. Stroke risks discussed and will plan to tx with DAPT x 3 weeks, then move to Plavix instead of ASA as out pt.  MRI shows tiny right paramedian pontine lacunar infarct and MRA of the brain and neck did not show large vessel stenosis or occlusion.  LDL cholesterol 53 mg percent.  Echocardiogram and hemoglobin A1c are pending Vitals:   06/04/21 0558 06/04/21 0818 06/04/21 0938 06/04/21 1241  BP: (!) 144/62  (!) 135/58 (!) 120/57  Pulse: (!) 53  (!) 54 (!) 55  Resp: 13  14 13   Temp:  98.4 F (36.9 C)    TempSrc:  Oral    SpO2: 96%  96% 99%  Weight:      Height:       CBC:  Recent Labs  Lab 06/03/21 1420 06/03/21 2130 06/04/21 0500  WBC 15.8* 12.5* 9.7  NEUTROABS 12.4*  --   --   HGB 12.0 11.9* 11.5*  HCT 38.4 38.8 38.5  MCV 83.7 85.7 86.7  PLT 275 286 292   Basic Metabolic Panel:  Recent Labs  Lab 06/03/21 1420 06/03/21 2130 06/04/21 0500  NA 139  --  139  K 3.2*  --  4.0  CL 104  --  107  CO2 25  --  24  GLUCOSE 125*  --  147*  BUN 23  --  17  CREATININE 1.26* 1.10* 1.29*  CALCIUM 8.6*  --  8.2*  MG  --  2.4  --   PHOS  --  2.4*  --    Lipid Panel:  Recent Labs  Lab 06/03/21 2130  CHOL 129  TRIG 80  HDL 60  CHOLHDL 2.2  VLDL 16  LDLCALC 53   HgbA1c: No results for input(s): HGBA1C in the last 168 hours. Urine Drug Screen:  Recent Labs  Lab 06/03/21 1813  LABOPIA NONE DETECTED  COCAINSCRNUR NONE DETECTED  LABBENZ NONE DETECTED  AMPHETMU NONE DETECTED  THCU NONE DETECTED  LABBARB NONE DETECTED    Alcohol Level  Recent Labs  Lab 06/03/21 1420  ETH <10    IMAGING past 24 hours CT HEAD WO CONTRAST  Result Date: 06/03/2021 CLINICAL DATA:  Reported recent CVA EXAM: CT HEAD WITHOUT CONTRAST TECHNIQUE: Contiguous axial images were obtained from the base of  the skull through the vertex without intravenous contrast. COMPARISON:  None. FINDINGS: Brain: There is symmetric superior frontal and parietal lobe atrophy. There is no intracranial mass, hemorrhage, extra-axial fluid collection, or midline shift. There is patchy decreased attenuation in portions of each centrum semiovale consistent with underlying periventricular small vessel disease. No well-defined acute infarct is evident on this study. Vascular: No hyperdense vessel. There is calcification in each carotid siphon region. Skull: Bony calvarium appears intact. Sinuses/Orbits: Slight mucosal thickening in several ethmoid air cells. Other visualized paranasal sinuses are clear. Orbits appear symmetric bilaterally. Other: Mastoid air cells clear. IMPRESSION: Areas of superior frontal and parietal lobe atrophy. Patchy periventricular small vessel disease noted. No acute infarct is appreciable on this study. No mass or hemorrhage. There are foci of arterial vascular calcification. There is mild ethmoid paranasal sinus disease. Electronically Signed   By: 08/03/2021 III M.D.   On: 06/03/2021 14:11   MR ANGIO HEAD WO CONTRAST  Result Date: 06/03/2021 CLINICAL DATA:  Stroke follow-up EXAM: MRA NECK WITHOUT AND WITH CONTRAST MRA HEAD WITHOUT CONTRAST TECHNIQUE: Multiplanar and multiecho pulse sequences of the neck were obtained without and with intravenous contrast. Angiographic images of the neck were obtained using MRA technique without and with intravenous contrast; Angiographic images of the Circle of Willis were obtained using MRA technique without intravenous contrast. CONTRAST:  73mL GADAVIST GADOBUTROL 1 MMOL/ML IV SOLN COMPARISON:  None. FINDINGS: MRA NECK FINDINGS Normal carotid and vertebral artery systems within the neck. MRA HEAD FINDINGS POSTERIOR CIRCULATION: --Vertebral arteries: Normal --Inferior cerebellar arteries: Normal. --Basilar artery: Normal. --Superior cerebellar arteries: Normal.  --Posterior cerebral arteries: Normal. ANTERIOR CIRCULATION: --Intracranial internal carotid arteries: Normal. --Anterior cerebral arteries (ACA): Normal. --Middle cerebral arteries (MCA): Normal. ANATOMIC VARIANTS: None IMPRESSION: Normal MRA of the head and neck. Electronically Signed   By: Deatra Robinson M.D.   On: 06/03/2021 21:49   MR ANGIO NECK W WO CONTRAST  Result Date: 06/03/2021 CLINICAL DATA:  Stroke follow-up EXAM: MRA NECK WITHOUT AND WITH CONTRAST MRA HEAD WITHOUT CONTRAST TECHNIQUE: Multiplanar and multiecho pulse sequences of the neck were obtained without and with intravenous contrast. Angiographic images of the neck were obtained using MRA technique without and with intravenous contrast; Angiographic images of the Circle of Willis were obtained using MRA technique without intravenous contrast. CONTRAST:  73mL GADAVIST GADOBUTROL 1 MMOL/ML IV SOLN COMPARISON:  None. FINDINGS: MRA NECK FINDINGS Normal carotid and vertebral artery systems within the neck. MRA HEAD FINDINGS POSTERIOR CIRCULATION: --Vertebral arteries: Normal --Inferior cerebellar arteries: Normal. --Basilar artery: Normal. --Superior cerebellar arteries: Normal. --Posterior cerebral arteries: Normal. ANTERIOR CIRCULATION: --Intracranial internal carotid arteries: Normal. --Anterior cerebral arteries (ACA): Normal. --Middle cerebral arteries (MCA): Normal. ANATOMIC VARIANTS: None IMPRESSION: Normal MRA of the head and neck. Electronically Signed   By: Deatra Robinson M.D.   On: 06/03/2021 21:49   MR BRAIN WO CONTRAST  Result Date: 06/03/2021 CLINICAL DATA:  Numbness, tingling and paresthesia. EXAM: MRI HEAD WITHOUT CONTRAST TECHNIQUE: Multiplanar, multiecho pulse sequences of the brain and surrounding structures were obtained without intravenous contrast. COMPARISON:  Head CT same day FINDINGS: Brain: Diffusion imaging shows acute infarction in the right para median pons. No other acute finding. Chronic small-vessel ischemic changes  are also present in the pons. No focal cerebellar insult. Cerebral hemispheres show old small vessel infarctions of the thalami and affecting the cerebral hemispheric deep and subcortical white matter. No cortical or large vessel territory infarction. No mass, hemorrhage, hydrocephalus or extra-axial collection. Vascular: Major vessels at the base of the brain show flow. Skull and upper cervical spine: Negative Sinuses/Orbits: Clear/normal Other: None IMPRESSION: Small acute infarction in the right para median pons. Chronic small-vessel ischemic changes of the pons, thalami and cerebral hemispheric white matter. Electronically Signed   By: Paulina Fusi M.D.   On: 06/03/2021 17:53   ECHOCARDIOGRAM COMPLETE  Result Date: 06/04/2021    ECHOCARDIOGRAM REPORT   Patient Name:   Deborah Le Date of Exam: 06/04/2021 Medical Rec #:  [id number]      Height:       70.0 in Accession #:    [id number]     Weight:       207.0 lb Date of Birth:  01/24/1946      BSA:          2.118 m Patient Age:    75 years       BP:           135/58 mmHg Patient Gender: F  HR:           45 bpm. Exam Location:  Inpatient Procedure: 2D Echo, Cardiac Doppler and Color Doppler Indications:    Stroke I63.9  History:        Patient has no prior history of Echocardiogram examinations.                 Risk Factors:Hypertension and Dyslipidemia.  Sonographer:    Elmarie Shiley Dance Referring Phys: 1 SCOTT GOLDSTON IMPRESSIONS  1. Left ventricular ejection fraction, by estimation, is 60 to 65%. The left ventricle has normal function. The left ventricle has no regional wall motion abnormalities. There is mild left ventricular hypertrophy. Left ventricular diastolic parameters are indeterminate.  2. Right ventricular systolic function is normal. The right ventricular size is normal. There is normal pulmonary artery systolic pressure.  3. Left atrial size was moderately dilated.  4. Right atrial size was mildly dilated.  5. A small pericardial  effusion is present. The pericardial effusion is posterior to the left ventricle.  6. The mitral valve is grossly normal. Trivial mitral valve regurgitation. No evidence of mitral stenosis.  7. The aortic valve is tricuspid. There is mild calcification of the aortic valve. There is mild thickening of the aortic valve. Aortic valve regurgitation is not visualized. Mild aortic valve sclerosis is present, with no evidence of aortic valve stenosis.  8. The inferior vena cava is normal in size with greater than 50% respiratory variability, suggesting right atrial pressure of 3 mmHg. Comparison(s): No prior Echocardiogram. Conclusion(s)/Recommendation(s): Normal biventricular function without evidence of hemodynamically significant valvular heart disease. No intracardiac source of embolism detected on this transthoracic study. A transesophageal echocardiogram is recommended to exclude cardiac source of embolism if clinically indicated. Of note, patient in sinus bradycardia in the upper 40s with occasional PACs throughout study. FINDINGS  Left Ventricle: Left ventricular ejection fraction, by estimation, is 60 to 65%. The left ventricle has normal function. The left ventricle has no regional wall motion abnormalities. The left ventricular internal cavity size was normal in size. There is  mild left ventricular hypertrophy. Left ventricular diastolic parameters are indeterminate. Right Ventricle: The right ventricular size is normal. Right vetricular wall thickness was not well visualized. Right ventricular systolic function is normal. There is normal pulmonary artery systolic pressure. The tricuspid regurgitant velocity is 2.56 m/s, and with an assumed right atrial pressure of 3 mmHg, the estimated right ventricular systolic pressure is 29.2 mmHg. Left Atrium: Left atrial size was moderately dilated. Right Atrium: Right atrial size was mildly dilated. Pericardium: A small pericardial effusion is present. The pericardial  effusion is posterior to the left ventricle. Presence of pericardial fat pad. Mitral Valve: The mitral valve is grossly normal. Trivial mitral valve regurgitation. No evidence of mitral valve stenosis. Tricuspid Valve: The tricuspid valve is grossly normal. Tricuspid valve regurgitation is mild . No evidence of tricuspid stenosis. Aortic Valve: The aortic valve is tricuspid. There is mild calcification of the aortic valve. There is mild thickening of the aortic valve. Aortic valve regurgitation is not visualized. Mild aortic valve sclerosis is present, with no evidence of aortic valve stenosis. Pulmonic Valve: The pulmonic valve was not well visualized. Pulmonic valve regurgitation is not visualized. No evidence of pulmonic stenosis. Aorta: The aortic root, ascending aorta and aortic arch are all structurally normal, with no evidence of dilitation or obstruction. Venous: The inferior vena cava is normal in size with greater than 50% respiratory variability, suggesting right atrial pressure of 3 mmHg. IAS/Shunts: The  atrial septum is grossly normal.  LEFT VENTRICLE PLAX 2D LVIDd:         5.30 cm LVIDs:         3.50 cm LV PW:         1.20 cm LV IVS:        1.00 cm LVOT diam:     2.10 cm LV SV:         65 LV SV Index:   31 LVOT Area:     3.46 cm  RIGHT VENTRICLE          IVC RV Basal diam:  2.70 cm  IVC diam: 1.80 cm TAPSE (M-mode): 1.6 cm LEFT ATRIUM              Index       RIGHT ATRIUM           Index LA diam:        4.40 cm  2.08 cm/m  RA Area:     18.50 cm LA Vol (A2C):   88.4 ml  41.74 ml/m RA Volume:   49.30 ml  23.28 ml/m LA Vol (A4C):   113.0 ml 53.35 ml/m LA Biplane Vol: 101.0 ml 47.68 ml/m  AORTIC VALVE LVOT Vmax:   78.90 cm/s LVOT Vmean:  53.900 cm/s LVOT VTI:    0.187 m  AORTA Ao Root diam: 3.50 cm Ao Asc diam:  3.40 cm MITRAL VALVE                TRICUSPID VALVE MV Area (PHT): 2.44 cm     TR Peak grad:   26.2 mmHg MV Decel Time: 311 msec     TR Vmax:        256.00 cm/s MV E velocity: 87.00 cm/s MV  A velocity: 127.00 cm/s  SHUNTS MV E/A ratio:  0.69         Systemic VTI:  0.19 m                             Systemic Diam: 2.10 cm Jodelle RedBridgette Christopher MD Electronically signed by Jodelle RedBridgette Christopher MD Signature Date/Time: 06/04/2021/12:12:34 PM    Final     PHYSICAL EXAM General: Pleasant elderly lady appears well-developed; no acute distress Psych: Affect appropriate to situation Eyes: No scleral injection HENT: No OP obstrucion Head: Normocephalic.  Cardiovascular: Normal rate and regular rhythm. Respiratory: Effort normal and breath sounds normal to anterior ascultation GI: Soft.  No distension. There is no tenderness.  Skin: WDI    Neurological Examination Mental Status: Alert, oriented, thought content appropriate.  Speech fluent without evidence of aphasia. Able to follow 3 step commands without difficulty. Cranial Nerves: II: Visual fields grossly normal,  III,IV, VI: ptosis not present, extra-ocular motions intact bilaterally, pupils equal, round, reactive to light and accommodation V,VII: smile symmetric, facial light touch sensation normal bilaterally VIII: hearing normal bilaterally IX,X: uvula rises symmetrically XI: bilateral shoulder shrug XII: midline tongue extension Motor: Right : Upper extremity   5/5    Left:     Upper extremity   5/5  Lower extremity   5/5     Lower extremity   5/5 Tone and bulk:normal tone throughout; no atrophy noted Sensory: decreased to light touch in left arm/leg. No extinction to bilateral stimuli Deep Tendon Reflexes: 2+ and symmetric throughout Plantars: Right: downgoing   Left: downgoing Cerebellar: normal finger-to-nose, normal rapid alternating movements and normal heel-to-shin test Gait: did not test  ASSESSMENT/PLAN [name] is a 75 y.o. female presenting with left sided numbness. 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  Stroke: Right pontine, secondary to small vessel disease source CTH:  Areas of superior frontal and parietal lobe atrophy. Patchy periventricular small vessel disease noted. No acute infarct is appreciable on this study. No mass or hemorrhage. There are foci of arterial vascular calcification. There is mild ethmoid paranasal sinus disease.  MRI  Small acute infarction in the right para median pons  MRA head/neck  wnl  2D Echo pending  LDL 53  HgbA1c No results found for requested labs within last 16109 hours.  VTE prophylaxis - lovenox    Diet   Diet heart healthy/carb modified Room service appropriate? Yes; Fluid consistency: Thin      ASA QOD prior to admission, now on DAPT x3 weeks, then Plavix only  Therapy recommendations:  pending  Disposition:  pending  Hypertension  Home meds:  Norvasc, Cozaar,   Stable . Permissive hypertension (OK if < 220/120) but gradually normalize in 5-7 days . Long-term BP goal normotensive  Hyperlipidemia  Home meds: Zocor   LDL 53, goal < 70  High intensity statin Lipitor   Continue home statin at discharge   Diabetes type II- no known diagnosis  HgbA1c No results found for requested labs within last 60454 hours., goal < 7.0  CBGs Recent Labs    06/04/21 0730 06/04/21 1142  GLUCAP 119* 144*      SSI  Other Stroke Risk Factors  Advanced Age >/= 3   Hospital day # 1  Desiree Metzger-Cihelka, ARNP-C, ANVP-BC Pager: [phone] I have personally obtained history,examined this patient, reviewed notes, independently viewed imaging studies, participated in medical decision making and plan of care.ROS completed by me personally and pertinent positives fully documented  I have made any additions or clarifications directly to the above note. Agree with note above.  She presented with left-sided numbness due to small right pontine lacunar infarct likely from small vessel disease.  Recommend aspirin Plavix for 3 weeks followed by Plavix alone and aggressive risk factor  modification.  Check echocardiogram results and hemoglobin A1c.  Discussed with patient and with Dr. Jama Flavors.  Greater than 50% time during this 35-minute visit were spent in counseling and coordination of care about a lacunar stroke and answering questions. Delia Heady, MD Medical Director [facility] Pager: [phone] 06/04/2021 4:16 PM  To contact Stroke Continuity provider, please refer to [email or web address]. After hours, contact General Neurology

## 2021-06-04 NOTE — Care Management CC44 (Signed)
Condition Code 44 Documentation Completed  Patient Details  Name: Deborah Le MRN: [id number] Date of Birth: [birth year]   Condition Code 44 given:    Patient signature on Condition Code 44 notice:    Documentation of 2 MD's agreement:    Code 44 added to claim:       Michel Bickers, RN 06/04/2021, 4:17 PM

## 2021-06-04 NOTE — ED Notes (Signed)
All appropriate discharge materials reviewed at length with patient. Time for questions provided. Pt has no other questions at this time and verbalizes understanding of all provided materials.  

## 2021-06-04 NOTE — ED Notes (Signed)
Discussed Huston Foley on monitor for patient with RN Vernard Gambles

## 2021-06-04 NOTE — Discharge Summary (Signed)
Name: Deborah Le MRN: [id number] DOB: 06/25/1946 75 y.o. PCP: Jonita Albee, MD  Date of Admission: 06/03/2021 12:55 PM Date of Discharge:  06/04/2021 Attending Physician: Merrilyn Puma, MD  Discharge Diagnosis: 1. Acute Right para median Pons CVA  2. Vasovagal lightheadedness  3. Chronic Bradycardia  Discharge Medications: Allergies as of 06/04/2021      Reactions   Penicillins Hives      Medication List    TAKE these medications   amLODipine 10 MG tablet Commonly known as: NORVASC Take 10 mg by mouth daily.   aspirin 81 MG EC tablet Take 1 tablet (81 mg total) by mouth daily. Swallow whole. Start taking on: June 05, 2021   clopidogrel 75 MG tablet Commonly known as: PLAVIX Take 1 tablet (75 mg total) by mouth daily. Start taking on: June 05, 2021   ferrous sulfate 325 (65 FE) MG tablet Take 325 mg by mouth daily.   levothyroxine 88 MCG tablet Commonly known as: SYNTHROID Take 88 mcg by mouth daily.   losartan 50 MG tablet Commonly known as: COZAAR Take 50 mg by mouth daily.   Myrbetriq 50 MG Tb24 tablet Generic drug: mirabegron ER Take 50 mg by mouth daily.   potassium chloride SA 20 MEQ tablet Commonly known as: KLOR-CON Take 1 tablet by mouth every other day.   simvastatin 40 MG tablet Commonly known as: ZOCOR Take 40 mg by mouth daily.       Disposition and follow-up:   [name] was discharged from [facility] in Stable condition.  At the hospital follow up visit please address:  1.  Acute R para median Pons CVA. Seen on MRI brain. Negative MRA head/neck. Discharging with DAPT x3 weeks, then plavix alone. Also discharging with home zocor 40mg  daily. F/u with [facility] in 4 weeks. Outpatient PT per PT evaluation.  2. Vasovagal lightheadedness. Symptom onset when out in the sun for an extended period of time and only with standing. F/u with PCP in 2 weeks.  3. Chronic bradycardia. EKG showing sinus bradycardia with  irregular rhythm. F/u PCP and cardiology as outpatient.  2.  Labs / imaging needed at time of follow-up: CBC, CMP  3.  Pending labs/ test needing follow-up: HbA1c, free T4, CK, PTH  Follow-up Appointments:  Follow-up Information    Guest, , MD Follow up in 2 week(s).   Specialty: Internal Medicine Contact information: [address] [place] [phone]        [facility]. Call in 4 week(s).   Why: Please call and make appointment for 4-week stroke follow up. Contact information: [address]     [address] [place] [phone]              Hospital Course by problem list:  1.  Acute R para median Pons CVA.  Patient with new onset left sided arm and leg numbness.  CT head showed areas of superior frontal and parietal lobe atrophy with patchy periventricular small vessel disease and foci of arterial vascular calcification, although no mass or hemorrhage noted.  MRI brain showing a small acute infarction in the right paramedian pons with chronic small vessel ischemic changes in the pons, thalami, and cerebral hemisphere white matter. Negative MRA head/neck.  No prior history of stroke.  Her stroke risk factors include hypertension, hyperlipidemia, age > 81.  Initially started on aspirin 325 mg daily, but switched to aspirin 81 mg and Plavix 75 mg daily  today.  Echo with EF and no evidence of a thrombus.  Discharging with DAPT x3 weeks, then plavix alone. Also discharging with home zocor 40mg  daily. F/u with [facility] in 4 weeks. Outpatient PT per PT evaluation.  2. Vasovagal lightheadedness. Symptom onset when out in the sun for an extended period of time at sister's funeral. Symptoms began prior to left arm and leg numbness so likely not related to CVA. Lightheadedness and blurry vision resolved after ~15 minutes. No further workup for this while inpatient. F/u with PCP in 2 weeks.  3.  Chronic bradycardia. EKG showing sinus bradycardia with irregular rhythm. Patient with long-standing history of bradycardia. F/u PCP and cardiology as outpatient.  4. HTN, HLD. On home norvasc 10mg  and cozaar 50mg  daily for HTN and home zocor 40mg  daily for HLD. Only needed norvasc 5mg  daily after exiting permissive HTN window.  She is no longer in the window for permissive hypertension, so will resume home medications at discharge until PCP f/u.  5. CKD Stage IIIa. Chronic and Stable. Patient follows with nephrologist and renal function is near her baseline at discharge.  6. Overactive bladder. Chronic and stable. Continued home myrbetriq 50mg  daily while here and will resume at discharge.  7. Hypothyroidism, chronic and stable, on synthroid 88mcg daily at home. TSH wnl and free T4 pending. Continued home synthroid while here and will resume at discharge.   Subjective: [name] states her dizziness only occurs with standing. It is improving. She continues to have numbness on her left side from her hip down. She left arm numbness has resolved.   Discussed results of MRI indicating patient had a CVA. [name] states she was at her sister's funeral when symptoms started suddenly, including vision changes, dizziness, and feeling as though she would fall. She was standing outside at the time. Vision changes improved after 15 minutes. Numbness started several hours later. She denies prior hx of known CVA.   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    Discharge Exam:   BP (!) 120/57 (BP Location: Right Arm)   Pulse (!) 55   Temp 98.6 F (37 C) (Oral)   Resp 13   Ht 5\' 10"  (1.778 m)   Wt 93.9 kg   SpO2 99%   BMI 29.70 kg/m  Discharge exam:  General: Overweight female, lying in bed, NAD. CV: Bradycardic rate and regular rhythm.  No murmurs rubs or gallops.  No peripheral edema  noted. Pulmonary: CTA BL, no adventitious sounds noted.  Normal work of breathing. Abdomen: Soft, nondistended, nontender, normoactive bowel sounds. Neuro: AAOx4, cranial nerves grossly intact.  Decreased sensation over left elbow and lateral side of left lower extremity.  Strength is 5 out of 5 in all 4 extremities. Skin: Warm and dry. Psych: Normal affect and normal mood.    Pertinent Labs, Studies, and Procedures:  CBC Latest Ref Rng & Units 06/04/2021 06/03/2021 06/03/2021  WBC 4.0 - 10.5 K/uL 9.7 12.5(H) 15.8(H)  Hemoglobin 12.0 - 15.0 g/dL 11.5(L) 11.9(L) 12.0  Hematocrit 36.0 - 46.0 % 38.5 38.8 38.4  Platelets 150 - 400 K/uL 292 286 275   CMP Latest Ref Rng & Units 06/04/2021 06/03/2021 06/03/2021  Glucose 70 - 99 mg/dL 478(G147(H) - 956(O125(H)  BUN 8 - 23 mg/dL 17 - 23  Creatinine 1.300.44 - 1.00 mg/dL 8.65(H1.29(H) 8.46(N1.10(H) 6.29(B1.26(H)  Sodium 135 -  145 mmol/L 139 - 139  Potassium 3.5 - 5.1 mmol/L 4.0 - 3.2(L)  Chloride 98 - 111 mmol/L 107 - 104  CO2 22 - 32 mmol/L 24 - 25  Calcium 8.9 - 10.3 mg/dL 8.2(L) - 8.6(L)  Total Protein 6.5 - 8.1 g/dL 6.6 - 8.0  Total Bilirubin 0.3 - 1.2 mg/dL 0.7 - 0.5  Alkaline Phos 38 - 126 U/L 83 - 100  AST 15 - 41 U/L 28 - 31  ALT 0 - 44 U/L 32 - 37   Lipid Panel     Component Value Date/Time   CHOL 129 06/03/2021 2130   TRIG 80 06/03/2021 2130   HDL 60 06/03/2021 2130   CHOLHDL 2.2 06/03/2021 2130   VLDL 16 06/03/2021 2130   LDLCALC 53 06/03/2021 2130    Urinalysis    Component Value Date/Time   COLORURINE AMBER (A) 06/03/2021 1812   APPEARANCEUR CLOUDY (A) 06/03/2021 1812   LABSPEC 1.016 06/03/2021 1812   PHURINE 7.0 06/03/2021 1812   GLUCOSEU NEGATIVE 06/03/2021 1812   HGBUR SMALL (A) 06/03/2021 1812   BILIRUBINUR NEGATIVE 06/03/2021 1812   KETONESUR NEGATIVE 06/03/2021 1812   PROTEINUR 30 (A) 06/03/2021 1812   NITRITE NEGATIVE 06/03/2021 1812   LEUKOCYTESUR SMALL (A) 06/03/2021 1812    Drugs of Abuse     Component Value Date/Time   LABOPIA NONE  DETECTED 06/03/2021 1813   COCAINSCRNUR NONE DETECTED 06/03/2021 1813   LABBENZ NONE DETECTED 06/03/2021 1813   AMPHETMU NONE DETECTED 06/03/2021 1813   THCU NONE DETECTED 06/03/2021 1813   LABBARB NONE DETECTED 06/03/2021 1813    PT  14.3 /  INR 1.1  APTT  29  Iron/TIBC/Ferritin/ %Sat    Component Value Date/Time   IRON 38 06/04/2021 0500   TIBC 255 06/04/2021 0500   FERRITIN 115 06/04/2021 0500   IRONPCTSAT 15 06/04/2021 0500   Troponins  11 > 17  TSH 2.52  CT HEAD WO CONTRAST  Result Date: 06/03/2021 CLINICAL DATA:  Reported recent CVA EXAM: CT HEAD WITHOUT CONTRAST TECHNIQUE: Contiguous axial images were obtained from the base of the skull through the vertex without intravenous contrast. COMPARISON:  None. FINDINGS: Brain: There is symmetric superior frontal and parietal lobe atrophy. There is no intracranial mass, hemorrhage, extra-axial fluid collection, or midline shift. There is patchy decreased attenuation in portions of each centrum semiovale consistent with underlying periventricular small vessel disease. No well-defined acute infarct is evident on this study. Vascular: No hyperdense vessel. There is calcification in each carotid siphon region. Skull: Bony calvarium appears intact. Sinuses/Orbits: Slight mucosal thickening in several ethmoid air cells. Other visualized paranasal sinuses are clear. Orbits appear symmetric bilaterally. Other: Mastoid air cells clear. IMPRESSION: Areas of superior frontal and parietal lobe atrophy. Patchy periventricular small vessel disease noted. No acute infarct is appreciable on this study. No mass or hemorrhage. There are foci of arterial vascular calcification. There is mild ethmoid paranasal sinus disease. Electronically Signed   By: Bretta Bang III M.D.   On: 06/03/2021 14:11   MR ANGIO HEAD WO CONTRAST  Result Date: 06/03/2021 CLINICAL DATA:  Stroke follow-up EXAM: MRA NECK WITHOUT AND WITH CONTRAST MRA HEAD WITHOUT CONTRAST  TECHNIQUE: Multiplanar and multiecho pulse sequences of the neck were obtained without and with intravenous contrast. Angiographic images of the neck were obtained using MRA technique without and with intravenous contrast; Angiographic images of the Circle of Willis were obtained using MRA technique without intravenous contrast. CONTRAST:  2mL GADAVIST GADOBUTROL 1 MMOL/ML IV  SOLN COMPARISON:  None. FINDINGS: MRA NECK FINDINGS Normal carotid and vertebral artery systems within the neck. MRA HEAD FINDINGS POSTERIOR CIRCULATION: --Vertebral arteries: Normal --Inferior cerebellar arteries: Normal. --Basilar artery: Normal. --Superior cerebellar arteries: Normal. --Posterior cerebral arteries: Normal. ANTERIOR CIRCULATION: --Intracranial internal carotid arteries: Normal. --Anterior cerebral arteries (ACA): Normal. --Middle cerebral arteries (MCA): Normal. ANATOMIC VARIANTS: None IMPRESSION: Normal MRA of the head and neck. Electronically Signed   By: Deatra Robinson M.D.   On: 06/03/2021 21:49   MR ANGIO NECK W WO CONTRAST  Result Date: 06/03/2021 CLINICAL DATA:  Stroke follow-up EXAM: MRA NECK WITHOUT AND WITH CONTRAST MRA HEAD WITHOUT CONTRAST TECHNIQUE: Multiplanar and multiecho pulse sequences of the neck were obtained without and with intravenous contrast. Angiographic images of the neck were obtained using MRA technique without and with intravenous contrast; Angiographic images of the Circle of Willis were obtained using MRA technique without intravenous contrast. CONTRAST:  9mL GADAVIST GADOBUTROL 1 MMOL/ML IV SOLN COMPARISON:  None. FINDINGS: MRA NECK FINDINGS Normal carotid and vertebral artery systems within the neck. MRA HEAD FINDINGS POSTERIOR CIRCULATION: --Vertebral arteries: Normal --Inferior cerebellar arteries: Normal. --Basilar artery: Normal. --Superior cerebellar arteries: Normal. --Posterior cerebral arteries: Normal. ANTERIOR CIRCULATION: --Intracranial internal carotid arteries: Normal.  --Anterior cerebral arteries (ACA): Normal. --Middle cerebral arteries (MCA): Normal. ANATOMIC VARIANTS: None IMPRESSION: Normal MRA of the head and neck. Electronically Signed   By: Deatra Robinson M.D.   On: 06/03/2021 21:49   MR BRAIN WO CONTRAST  Result Date: 06/03/2021 CLINICAL DATA:  Numbness, tingling and paresthesia. EXAM: MRI HEAD WITHOUT CONTRAST TECHNIQUE: Multiplanar, multiecho pulse sequences of the brain and surrounding structures were obtained without intravenous contrast. COMPARISON:  Head CT same day FINDINGS: Brain: Diffusion imaging shows acute infarction in the right para median pons. No other acute finding. Chronic small-vessel ischemic changes are also present in the pons. No focal cerebellar insult. Cerebral hemispheres show old small vessel infarctions of the thalami and affecting the cerebral hemispheric deep and subcortical white matter. No cortical or large vessel territory infarction. No mass, hemorrhage, hydrocephalus or extra-axial collection. Vascular: Major vessels at the base of the brain show flow. Skull and upper cervical spine: Negative Sinuses/Orbits: Clear/normal Other: None IMPRESSION: Small acute infarction in the right para median pons. Chronic small-vessel ischemic changes of the pons, thalami and cerebral hemispheric white matter. Electronically Signed   By: Paulina Fusi M.D.   On: 06/03/2021 17:53   ECHOCARDIOGRAM COMPLETE  Result Date: 06/04/2021    ECHOCARDIOGRAM REPORT   Patient Name:   ANEYAH Le Date of Exam: 06/04/2021 Medical Rec #:  [id number]      Height:       70.0 in Accession #:    [id number]     Weight:       207.0 lb Date of Birth:  1946/09/19      BSA:          2.118 m Patient Age:    75 years       BP:           135/58 mmHg Patient Gender: F              HR:           45 bpm. Exam Location:  Inpatient Procedure: 2D Echo, Cardiac Doppler and Color Doppler Indications:    Stroke I63.9  History:        Patient has no prior history of Echocardiogram  examinations.  Risk Factors:Hypertension and Dyslipidemia.  Sonographer:    Elmarie Shiley Dance Referring Phys: [id number] SCOTT GOLDSTON IMPRESSIONS  1. Left ventricular ejection fraction, by estimation, is 60 to 65%. The left ventricle has normal function. The left ventricle has no regional wall motion abnormalities. There is mild left ventricular hypertrophy. Left ventricular diastolic parameters are indeterminate.  2. Right ventricular systolic function is normal. The right ventricular size is normal. There is normal pulmonary artery systolic pressure.  3. Left atrial size was moderately dilated.  4. Right atrial size was mildly dilated.  5. A small pericardial effusion is present. The pericardial effusion is posterior to the left ventricle.  6. The mitral valve is grossly normal. Trivial mitral valve regurgitation. No evidence of mitral stenosis.  7. The aortic valve is tricuspid. There is mild calcification of the aortic valve. There is mild thickening of the aortic valve. Aortic valve regurgitation is not visualized. Mild aortic valve sclerosis is present, with no evidence of aortic valve stenosis.  8. The inferior vena cava is normal in size with greater than 50% respiratory variability, suggesting right atrial pressure of 3 mmHg. Comparison(s): No prior Echocardiogram. Conclusion(s)/Recommendation(s): Normal biventricular function without evidence of hemodynamically significant valvular heart disease. No intracardiac source of embolism detected on this transthoracic study. A transesophageal echocardiogram is recommended to exclude cardiac source of embolism if clinically indicated. Of note, patient in sinus bradycardia in the upper 40s with occasional PACs throughout study. FINDINGS  Left Ventricle: Left ventricular ejection fraction, by estimation, is 60 to 65%. The left ventricle has normal function. The left ventricle has no regional wall motion abnormalities. The left ventricular internal cavity size  was normal in size. There is  mild left ventricular hypertrophy. Left ventricular diastolic parameters are indeterminate. Right Ventricle: The right ventricular size is normal. Right vetricular wall thickness was not well visualized. Right ventricular systolic function is normal. There is normal pulmonary artery systolic pressure. The tricuspid regurgitant velocity is 2.56 m/s, and with an assumed right atrial pressure of 3 mmHg, the estimated right ventricular systolic pressure is 29.2 mmHg. Left Atrium: Left atrial size was moderately dilated. Right Atrium: Right atrial size was mildly dilated. Pericardium: A small pericardial effusion is present. The pericardial effusion is posterior to the left ventricle. Presence of pericardial fat pad. Mitral Valve: The mitral valve is grossly normal. Trivial mitral valve regurgitation. No evidence of mitral valve stenosis. Tricuspid Valve: The tricuspid valve is grossly normal. Tricuspid valve regurgitation is mild . No evidence of tricuspid stenosis. Aortic Valve: The aortic valve is tricuspid. There is mild calcification of the aortic valve. There is mild thickening of the aortic valve. Aortic valve regurgitation is not visualized. Mild aortic valve sclerosis is present, with no evidence of aortic valve stenosis. Pulmonic Valve: The pulmonic valve was not well visualized. Pulmonic valve regurgitation is not visualized. No evidence of pulmonic stenosis. Aorta: The aortic root, ascending aorta and aortic arch are all structurally normal, with no evidence of dilitation or obstruction. Venous: The inferior vena cava is normal in size with greater than 50% respiratory variability, suggesting right atrial pressure of 3 mmHg. IAS/Shunts: The atrial septum is grossly normal.  LEFT VENTRICLE PLAX 2D LVIDd:         5.30 cm LVIDs:         3.50 cm LV PW:         1.20 cm LV IVS:        1.00 cm LVOT diam:     2.10 cm LV SV:  65 LV SV Index:   31 LVOT Area:     3.46 cm  RIGHT  VENTRICLE          IVC RV Basal diam:  2.70 cm  IVC diam: 1.80 cm TAPSE (M-mode): 1.6 cm LEFT ATRIUM              Index       RIGHT ATRIUM           Index LA diam:        4.40 cm  2.08 cm/m  RA Area:     18.50 cm LA Vol (A2C):   88.4 ml  41.74 ml/m RA Volume:   49.30 ml  23.28 ml/m LA Vol (A4C):   113.0 ml 53.35 ml/m LA Biplane Vol: 101.0 ml 47.68 ml/m  AORTIC VALVE LVOT Vmax:   78.90 cm/s LVOT Vmean:  53.900 cm/s LVOT VTI:    0.187 m  AORTA Ao Root diam: 3.50 cm Ao Asc diam:  3.40 cm MITRAL VALVE                TRICUSPID VALVE MV Area (PHT): 2.44 cm     TR Peak grad:   26.2 mmHg MV Decel Time: 311 msec     TR Vmax:        256.00 cm/s MV E velocity: 87.00 cm/s MV A velocity: 127.00 cm/s  SHUNTS MV E/A ratio:  0.69         Systemic VTI:  0.19 m                             Systemic Diam: 2.10 cm Deborah Red MD Electronically signed by Deborah Red MD Signature Date/Time: 06/04/2021/12:12:34 PM    Final      Discharge Instructions: Discharge Instructions    Ambulatory referral to Physical Therapy   Complete by: As directed    Call MD for:  difficulty breathing, headache or visual disturbances   Complete by: As directed    Call MD for:  extreme fatigue   Complete by: As directed    Call MD for:  persistant dizziness or light-headedness   Complete by: As directed    Call MD for:  persistant nausea and vomiting   Complete by: As directed    Call MD for:  severe uncontrolled pain   Complete by: As directed    Call MD for:  temperature >100.4   Complete by: As directed    Diet - low sodium heart healthy   Complete by: As directed    Discharge instructions   Complete by: As directed    [name], it was a pleasure taking care of you during your stay here.  You came in with left-sided numbness and were found to have a small stroke in the right side of your brain.  You were treated with medical therapy and will need to stay on some of these medications even when you get  home.  Please note the following:  1.  You will need to take Aspirin and Plavix daily for 3 weeks, and then plavix alone for an extended period of time.   2. Please follow up with [facility] in 4 weeks so that they can adjust your medications as needed.  3. Please follow up with your primary care doctor in 2 weeks.  4. You will need outpatient physical therapy to help with strength and balance.   Increase activity slowly  Complete by: As directed       Signed: Merrilyn Puma, MD 06/04/2021, 3:55 PM   Pager: [phone]

## 2021-06-05 LAB — PTH, INTACT AND CALCIUM
Calcium, Total (PTH): 8.4 mg/dL — ABNORMAL LOW (ref 8.7–10.3)
PTH: 55 pg/mL (ref 15–65)

## 2021-06-25 ENCOUNTER — Ambulatory Visit: Payer: Medicare Other | Attending: Internal Medicine | Admitting: Physical Therapy

## 2021-06-25 ENCOUNTER — Encounter: Payer: Self-pay | Admitting: Physical Therapy

## 2021-06-25 ENCOUNTER — Other Ambulatory Visit: Payer: Self-pay

## 2021-06-25 DIAGNOSIS — M6281 Muscle weakness (generalized): Secondary | ICD-10-CM | POA: Diagnosis present

## 2021-06-25 DIAGNOSIS — R2681 Unsteadiness on feet: Secondary | ICD-10-CM | POA: Insufficient documentation

## 2021-06-25 NOTE — Therapy (Signed)
[facility] Outpatient Rehabilitation [facility] [address]  [address] [place], [place], [address] Phone: [phone]   Fax:  [phone]  Physical Therapy Evaluation  Patient Details  Name: Deborah Le MRN: [id number] Date of Birth: 17-Jun-1946 Referring Provider (PT): Vicente Masson, MD (ED) (PCP - Princess Bruins)   Encounter Date: 06/25/2021   PT End of Session - 06/25/21 1530     Visit Number 1    Number of Visits 12    Date for PT Re-Evaluation 08/06/21    Authorization Type UHC Medicare & Medicaid    PT Start Time 1530    PT Stop Time 1612    PT Time Calculation (min) 42 min    Activity Tolerance Patient tolerated treatment well    Behavior During Therapy [facility] for tasks assessed/performed             Past Medical History:  Diagnosis Date   History of heart artery stent    Hypercholesteremia    Hypertension    Overactive bladder     History reviewed. No pertinent surgical history.  There were no vitals filed for this visit.    Subjective Assessment - 06/25/21 1533     Subjective Pt reports she had a TIA ~2 weeks ago with numbness and tingling on L side. Senstation is much improved but still notes a minor loss on L. She denies any weakness or impaired mobility but feels that her balance is off. No falls reported.    Patient Stated Goals "to be my old self again"    Currently in Pain? No/denies                [facility] PT Assessment - 06/25/21 1530       Assessment   Medical Diagnosis TIA - L sided numbness and tingling; Impaired balance    Referring Provider (PT) Vicente Masson, MD (ED)   PCP - Princess Bruins   Onset Date/Surgical Date 06/03/21    Hand Dominance Right    Next MD Visit ~3 months with PCP    Prior Therapy none      Precautions   Precautions Fall      Balance Screen   Has the patient fallen in the past 6 months No    Has the patient had a decrease in activity level because of a fear of falling?  No    Is the  patient reluctant to leave their home because of a fear of falling?  No      Home Environment   Living Environment Private residence    Living Arrangements Children   son who is epileptic   Type of Home House    Home Access Stairs to enter    Entrance Stairs-Number of Steps 1    Home Layout Two level;Bed/bath upstairs    Home Equipment Gerber - single point   uses SPC to go to [employer] d/t balance     Prior Function   Level of Independence Independent    Vocation Retired    Horticulturist, commercial, writes poetry, mostly sedentary      Cognition   Overall Cognitive Status Within Functional Limits for tasks assessed      Sensation   Light Touch Appears Intact    Hot/Cold Appears Intact      Coordination   Gross Motor Movements are Fluid and Coordinated Yes    Fine Motor Movements are Fluid and Coordinated Yes    Heel [place] Test [facility]  ROM / Strength   AROM / PROM / Strength AROM;Strength      AROM   Overall AROM  Within functional limits for tasks performed      Strength   Strength Assessment Site Shoulder;Elbow;Hand;Hip;Knee;Ankle    Right/Left Shoulder --   5/5 for all motions   Right/Left Elbow --   5/5 for all motions   Right Hand Gross Grasp Functional    Left Hand Gross Grasp Functional    Right/Left Hip Right;Left    Right Hip Flexion 4-/5    Right Hip Extension 4/5    Right Hip External Rotation  4/5    Right Hip Internal Rotation 4+/5    Right Hip ABduction 4+/5    Right Hip ADduction 4+/5    Left Hip Flexion 4-/5    Left Hip Extension 4/5    Left Hip External Rotation 4-/5    Left Hip Internal Rotation 4+/5    Left Hip ABduction 4/5    Left Hip ADduction 4+/5    Right/Left Knee Right;Left    Right Knee Flexion 4/5    Right Knee Extension 4+/5    Left Knee Flexion 4/5    Left Knee Extension 4+/5    Right/Left Ankle Right;Left    Right Ankle Dorsiflexion 4-/5    Right Ankle Plantar Flexion 4/5   15 SLS heel raises   Left Ankle Dorsiflexion 4-/5    Left  Ankle Plantar Flexion 4/5   15 SLS heel raises - limited lift as compared to R     Ambulation/Gait   Ambulation/Gait Yes    Ambulation/Gait Assistance 7: Independent    Assistive device None    Gait Pattern Step-through pattern;Trunk flexed    Ambulation Surface Level;Indoor    Gait velocity 2.75 ft/sec      Standardized Balance Assessment   Standardized Balance Assessment Timed Up and Go Test;10 meter walk test    10 Meter Walk 11.91 sec      Timed Up and Go Test   Normal TUG (seconds) 15.59      Functional Gait  Assessment   Gait assessed  Yes    Gait Level Surface Walks 20 ft in less than 7 sec but greater than 5.5 sec, uses assistive device, slower speed, mild gait deviations, or deviates 6-10 in outside of the 12 in walkway width.    Change in Gait Speed Makes only minor adjustments to walking speed, or accomplishes a change in speed with significant gait deviations, deviates 10-15 in outside the 12 in walkway width, or changes speed but loses balance but is able to recover and continue walking.    Gait with Horizontal Head Turns Performs head turns smoothly with slight change in gait velocity (eg, minor disruption to smooth gait path), deviates 6-10 in outside 12 in walkway width, or uses an assistive device.    Gait with Vertical Head Turns Performs task with slight change in gait velocity (eg, minor disruption to smooth gait path), deviates 6 - 10 in outside 12 in walkway width or uses assistive device    Gait and Pivot Turn Pivot turns safely within 3 sec and stops quickly with no loss of balance.    Step Over Obstacle Is able to step over one shoe box (4.5 in total height) without changing gait speed. No evidence of imbalance.    Gait with Narrow Base of Support Ambulates less than 4 steps heel to toe or cannot perform without assistance.    Gait with Eyes  Closed Walks 20 ft, slow speed, abnormal gait pattern, evidence for imbalance, deviates 10-15 in outside 12 in walkway width.  Requires more than 9 sec to ambulate 20 ft.    Ambulating Backwards Walks 20 ft, uses assistive device, slower speed, mild gait deviations, deviates 6-10 in outside 12 in walkway width.    Steps Alternating feet, must use rail.    Total Score 17    FGA comment: < 19 = high risk fall                        Objective measurements completed on examination: See above findings.                 PT Short Term Goals - 06/25/21 1612       PT SHORT TERM GOAL #1   Title Patient will be independent with initial HEP    Status New    Target Date 07/16/21      PT SHORT TERM GOAL #2   Title Patient will verbalize understanding of fall prevention measures in home to reduce risk for falls    Status New    Target Date 08/06/21               PT Long Term Goals - 06/25/21 1612       PT LONG TERM GOAL #1   Title Patient will be independent with ongoing/advanced HEP +/- [place] Fall Prevention Program for self-management at home    Status New    Target Date 08/06/21      PT LONG TERM GOAL #2   Title Patient will demonstrate improved B LE strength to >/= 4 to 4+/5 for improved stability and ease of mobility    Status New    Target Date 08/06/21      PT LONG TERM GOAL #3   Title Patient will demonstrate decreased TUG time to </= 13.5 sec to decrease risk for falls with transitional mobility    Status New    Target Date 08/06/21      PT LONG TERM GOAL #4   Title Patient will improve FGA score to >/= 25/30 to improve gait stability and reduce risk for falls    Status New    Target Date 08/06/21      PT LONG TERM GOAL #5   Title Patient to demonstrate symmetrical step length, knee flexion, and good heel-toe pattern with upright posture when ambulating with or w/o [facility]    Status New    Target Date 08/06/21                    Plan - 06/25/21 1612     Clinical Impression Statement Deborah Le is a 75 y/o female who presents to OP PT with referral for L  sided numbness and tingling s/p TIA on 06/03/21. She reports the numbness and tingling have mostly resolved but feels that her balance is not yet back to normal causing her to be more dependent on her SPC. Current deficits include mild B LE weakness, gait abnormalities including forward flexed posture with decreased gait speed of 2.75 ft/sec, and high risk for falls per TUG time of 15.56 sec and FGA score of 17/30. Meha will benefit from skilled PT to address above strength and balance deficits to increase safety/stability with transfers, gait and ADLs to decrease risk for falls.    Personal Factors and Comorbidities Age;Comorbidity 3+;Fitness;Past/Current Experience;Social Background    Comorbidities TIA 06/03/21,  HTN, HLD, h/o cardiac stent, overactive bladder    Examination-Activity Limitations Locomotion Level;Stand;Stairs;Transfers    Examination-Participation Restrictions Cleaning;Community Activity;Laundry;Meal Prep;Shop    Stability/Clinical Decision Making Stable/Uncomplicated    Clinical Decision Making Low    Rehab Potential Good    PT Frequency 2x / week    PT Duration 6 weeks    PT Treatment/Interventions ADLs/Self Care Home Management;DME Instruction;Gait training;Stair training;Functional mobility training;Therapeutic activities;Therapeutic exercise;Balance training;Neuromuscular re-education;Patient/family education;Energy conservation    PT Next Visit Plan Create initial HEP for LE strengthening and balance    Consulted and Agree with Plan of Care Patient             Patient will benefit from skilled therapeutic intervention in order to improve the following deficits and impairments:  Abnormal gait, Decreased activity tolerance, Decreased balance, Decreased endurance, Decreased knowledge of precautions, Decreased mobility, Decreased safety awareness, Decreased strength, Difficulty walking, Impaired perceived functional ability, Improper body mechanics, Postural  dysfunction  Visit Diagnosis: Unsteadiness on feet - Plan: PT plan of care cert/re-cert  Muscle weakness (generalized) - Plan: PT plan of care cert/re-cert     Problem List Patient Active Problem List   Diagnosis Date Noted   CVA (cerebral vascular accident) (HCC) 06/03/2021   HTN (hypertension) 06/03/2021    Marry GuanJoAnne M Nnamdi Dacus, PT, MPT 06/25/2021, 5:42 PM  [facility] [place] [address]  [address] [place], [place], [address] Phone: [phone]   Fax:  [phone]  Name: Deborah Le MRN: [id number] Date of Birth: [birth year]

## 2021-07-04 ENCOUNTER — Ambulatory Visit: Payer: Medicare Other | Attending: Internal Medicine | Admitting: Physical Therapy

## 2021-07-04 DIAGNOSIS — R2681 Unsteadiness on feet: Secondary | ICD-10-CM | POA: Insufficient documentation

## 2021-07-04 DIAGNOSIS — M6281 Muscle weakness (generalized): Secondary | ICD-10-CM | POA: Insufficient documentation

## 2021-07-16 ENCOUNTER — Encounter: Payer: Medicare Other | Admitting: Physical Therapy

## 2021-07-19 ENCOUNTER — Ambulatory Visit: Payer: Medicare Other

## 2021-07-19 ENCOUNTER — Other Ambulatory Visit: Payer: Self-pay

## 2021-07-19 DIAGNOSIS — R2681 Unsteadiness on feet: Secondary | ICD-10-CM | POA: Diagnosis present

## 2021-07-19 DIAGNOSIS — M6281 Muscle weakness (generalized): Secondary | ICD-10-CM | POA: Diagnosis present

## 2021-07-19 NOTE — Therapy (Signed)
[facility] [address]  [address] [place], [place], [address] Phone: [phone]   Fax:  [phone]  Physical Therapy Treatment  Patient Details  Name: Deborah Le MRN: [id number] Date of Birth: [birth year] Referring Provider (PT): Vicente Masson, MD (ED) (PCP - Princess Bruins)   Encounter Date: 07/19/2021   PT End of Session - 07/19/21 1615     Visit Number 2    Number of Visits 12    Date for PT Re-Evaluation 08/06/21    Authorization Type UHC Medicare & Medicaid    PT Start Time 1534    PT Stop Time 1612    PT Time Calculation (min) 38 min    Activity Tolerance Patient tolerated treatment well    Behavior During Therapy WFL for tasks assessed/performed             Past Medical History:  Diagnosis Date   History of heart artery stent    Hypercholesteremia    Hypertension    Overactive bladder     History reviewed. No pertinent surgical history.  There were no vitals filed for this visit.   Subjective Assessment - 07/19/21 1538     Subjective Pt notes that she is doing well, has not experienced N/T on the L side since her nephew prayed for her.    Patient Stated Goals "to be my old self again"    Currently in Pain? No/denies                               [facility] Adult PT Treatment/Exercise - 07/19/21 0001       Neuro Re-ed    Neuro Re-ed Details  DLS on airex with narrow BOS 30 seconds      Exercises   Exercises Knee/Hip      Knee/Hip Exercises: Aerobic   Nustep L1x67min      Knee/Hip Exercises: Standing   Heel Raises Both;10 reps    Heel Raises Limitations UE support    Hip Flexion Stengthening;Both;10 reps;Knee bent    Hip Extension Stengthening;Both;10 reps;Knee straight    Extension Limitations yellow TB    Forward Step Up Both;2 sets;10 reps;Hand Hold: 2;Step Height: 6"      Knee/Hip Exercises: Seated   Long Arc Quad Strengthening;Both;10 reps;Weights    Long Arc  Quad Weight 2 lbs.    Sit to Sand 10 reps;without UE support   with yellow weighted ball                   PT Education - 07/19/21 1827     Education Details HEP update: [email or web address]    Person(s) Educated Patient    Methods Explanation;Demonstration;Handout    Comprehension Verbalized understanding;Returned demonstration              PT Short Term Goals - 07/19/21 1615       PT SHORT TERM GOAL #1   Title Patient will be independent with initial HEP    Status On-going    Target Date 07/16/21      PT SHORT TERM GOAL #2   Title Patient will verbalize understanding of fall prevention measures in home to reduce risk for falls    Status On-going    Target Date 08/06/21               PT Long Term Goals - 07/19/21 1616       PT LONG  TERM GOAL #1   Title Patient will be independent with ongoing/advanced HEP +/- [place] Fall Prevention Program for self-management at home    Status On-going      PT LONG TERM GOAL #2   Title Patient will demonstrate improved B LE strength to >/= 4 to 4+/5 for improved stability and ease of mobility    Status On-going      PT LONG TERM GOAL #3   Title Patient will demonstrate decreased TUG time to </= 13.5 sec to decrease risk for falls with transitional mobility    Status On-going      PT LONG TERM GOAL #4   Title Patient will improve FGA score to >/= 25/30 to improve gait stability and reduce risk for falls    Status On-going      PT LONG TERM GOAL #5   Title Patient to demonstrate symmetrical step length, knee flexion, and good heel-toe pattern with upright posture when ambulating with or w/o SPC    Status On-going                   Plan - 07/19/21 1616     Clinical Impression Statement Pt has been doing well since her last visit. Pt has not experiencing N/T along her L side ever since she reported that a family member prayed for her. She shows good stability with her SPC but I did notice a slight R LE bias in WB. R  LE bias was also noted during STS and less control when standing on her L LE alone. Provided an HEP update with hip strengthening exercises and administered a yellow TB for resistance. Based on today's performance she seems to be doing good, with no N/T, and no weakness noted in her L LE.    Personal Factors and Comorbidities Age;Comorbidity 3+;Fitness;Past/Current Experience;Social Background    Comorbidities TIA 06/03/21, HTN, HLD, h/o cardiac stent, overactive bladder    PT Frequency 2x / week    PT Duration 6 weeks    PT Treatment/Interventions ADLs/Self Care Home Management;DME Instruction;Gait training;Stair training;Functional mobility training;Therapeutic activities;Therapeutic exercise;Balance training;Neuromuscular re-education;Patient/family education;Energy conservation    PT Next Visit Plan Review HEP; LE strengthening and balance    Consulted and Agree with Plan of Care Patient             Patient will benefit from skilled therapeutic intervention in order to improve the following deficits and impairments:  Abnormal gait, Decreased activity tolerance, Decreased balance, Decreased endurance, Decreased knowledge of precautions, Decreased mobility, Decreased safety awareness, Decreased strength, Difficulty walking, Impaired perceived functional ability, Improper body mechanics, Postural dysfunction  Visit Diagnosis: Unsteadiness on feet  Muscle weakness (generalized)     Problem List Patient Active Problem List   Diagnosis Date Noted   CVA (cerebral vascular accident) (HCC) 06/03/2021   HTN (hypertension) 06/03/2021    Darleene Cleaver, PTA 07/19/2021, 6:28 PM  [facility] [address]  [address] [place], [place], [address] Phone: [phone]   Fax:  [phone]  Name: Deborah Le MRN: [id number] Date of Birth: 1946/06/22

## 2021-07-19 NOTE — Patient Instructions (Signed)
Access Code: [email or web address] URL: [email or web address] Date: 07/19/2021 Prepared by: Verta Ellen  Exercises Heel Toe Raises with Counter Support - 2 x daily - 7 x weekly - 2 sets - 10 reps Standing March with Counter Support - 2 x daily - 7 x weekly - 2 sets - 10 reps - 3 seconds hold Standing Hip Extension with Resistance at Ankles and Counter Support - 2 x daily - 7 x weekly - 2 sets - 10 reps Sit to Stand Without Arm Support - 2 x daily - 7 x weekly - 2 sets - 10 reps

## 2021-07-23 ENCOUNTER — Ambulatory Visit: Payer: Medicare Other

## 2021-07-23 ENCOUNTER — Other Ambulatory Visit: Payer: Self-pay

## 2021-07-23 DIAGNOSIS — R2681 Unsteadiness on feet: Secondary | ICD-10-CM

## 2021-07-23 DIAGNOSIS — M6281 Muscle weakness (generalized): Secondary | ICD-10-CM

## 2021-07-23 NOTE — Therapy (Signed)
[facility] Outpatient Rehabilitation [facility] [address]  [address] [place], [place], [address] Phone: [phone]   Fax:  [phone]  Physical Therapy Treatment  Patient Details  Name: Deborah Le MRN: [id number] Date of Birth: Feb 26, 1946 Referring Provider (PT): Vicente Masson, MD (ED) (PCP - Princess Bruins)   Encounter Date: 07/23/2021   PT End of Session - 07/23/21 1613     Visit Number 3    Number of Visits 12    Date for PT Re-Evaluation 08/06/21    Authorization Type UHC Medicare & Medicaid    PT Start Time 1530    PT Stop Time 1613    PT Time Calculation (min) 43 min    Activity Tolerance Patient tolerated treatment well    Behavior During Therapy [facility] for tasks assessed/performed             Past Medical History:  Diagnosis Date   History of heart artery stent    Hypercholesteremia    Hypertension    Overactive bladder     History reviewed. No pertinent surgical history.  There were no vitals filed for this visit.   Subjective Assessment - 07/23/21 1527     Subjective Pt notes that she has been doing fine with her exercises.    Patient Stated Goals "to be my old self again"    Currently in Pain? No/denies                               [facility] Adult PT Treatment/Exercise - 07/23/21 0001       Exercises   Exercises Knee/Hip      Knee/Hip Exercises: Aerobic   Nustep L3x79min      Knee/Hip Exercises: Standing   Hip Flexion Stengthening;Both;10 reps;Knee bent;2 sets    Hip Flexion Limitations 2#    Hip Abduction Stengthening;Both;10 reps;Knee straight;2 sets    Abduction Limitations 2#      Knee/Hip Exercises: Seated   Long Arc Quad Strengthening;Both;2 sets;10 reps;Weights    Long Arc Quad Weight 2 lbs.    Hamstring Curl Strengthening;Both;2 sets;10 reps    Hamstring Limitations red TB    Sit to Sand without UE support;15 reps   yellow weight ball                Balance Exercises -  07/23/21 0001       Balance Exercises: Standing   Standing Eyes Opened Narrow base of support (BOS);Head turns;Foam/compliant surface   head turns; fwd and lateral reaches with narrow BOS on airex 10x;   Other Standing Exercises mini squats from airex 10x                 PT Short Term Goals - 07/23/21 1600       PT SHORT TERM GOAL #1   Title Patient will be independent with initial HEP    Status Achieved    Target Date 07/16/21      PT SHORT TERM GOAL #2   Title Patient will verbalize understanding of fall prevention measures in home to reduce risk for falls    Status On-going    Target Date 08/06/21               PT Long Term Goals - 07/19/21 1616       PT LONG TERM GOAL #1   Title Patient will be independent with ongoing/advanced HEP +/- [employer] Program for  self-management at home    Status On-going      PT LONG TERM GOAL #2   Title Patient will demonstrate improved B LE strength to >/= 4 to 4+/5 for improved stability and ease of mobility    Status On-going      PT LONG TERM GOAL #3   Title Patient will demonstrate decreased TUG time to </= 13.5 sec to decrease risk for falls with transitional mobility    Status On-going      PT LONG TERM GOAL #4   Title Patient will improve FGA score to >/= 25/30 to improve gait stability and reduce risk for falls    Status On-going      PT LONG TERM GOAL #5   Title Patient to demonstrate symmetrical step length, knee flexion, and good heel-toe pattern with upright posture when ambulating with or w/o SPC    Status On-going                   Plan - 07/23/21 1615     Clinical Impression Statement Pt responded well to treatment. Progressed ther ex utilizing increased resistance and increasing the need for LE stabilization. She requires a lot of instruction with exercises for controlled movements. Also needs cues for posterior WS with squats and LE position for stability. She has not reported any  falls and demonstrates good stability with SPC. She denied having any concerns with HEP and would continue to benefit from general LE strengthening to improve balance during ADLs.    Personal Factors and Comorbidities Age;Comorbidity 3+;Fitness;Past/Current Experience;Social Background    Comorbidities TIA 06/03/21, HTN, HLD, h/o cardiac stent, overactive bladder    PT Frequency 2x / week    PT Duration 6 weeks    PT Treatment/Interventions ADLs/Self Care Home Management;DME Instruction;Gait training;Stair training;Functional mobility training;Therapeutic activities;Therapeutic exercise;Balance training;Neuromuscular re-education;Patient/family education;Energy conservation    PT Next Visit Plan proximal LE strengthening and balance    Consulted and Agree with Plan of Care Patient             Patient will benefit from skilled therapeutic intervention in order to improve the following deficits and impairments:  Abnormal gait, Decreased activity tolerance, Decreased balance, Decreased endurance, Decreased knowledge of precautions, Decreased mobility, Decreased safety awareness, Decreased strength, Difficulty walking, Impaired perceived functional ability, Improper body mechanics, Postural dysfunction  Visit Diagnosis: Unsteadiness on feet  Muscle weakness (generalized)     Problem List Patient Active Problem List   Diagnosis Date Noted   CVA (cerebral vascular accident) (HCC) 06/03/2021   HTN (hypertension) 06/03/2021    Darleene Cleaver, PTA 07/23/2021, 5:22 PM  [facility] [address]  [address] [place], [place], [address] Phone: [phone]   Fax:  [phone]  Name: Deborah Le MRN: [id number] Date of Birth: 1946/03/07

## 2021-07-26 ENCOUNTER — Encounter: Payer: Self-pay | Admitting: Physical Therapy

## 2021-07-26 ENCOUNTER — Ambulatory Visit: Payer: Medicare Other | Admitting: Physical Therapy

## 2021-07-26 ENCOUNTER — Other Ambulatory Visit: Payer: Self-pay

## 2021-07-26 DIAGNOSIS — M6281 Muscle weakness (generalized): Secondary | ICD-10-CM

## 2021-07-26 DIAGNOSIS — R2681 Unsteadiness on feet: Secondary | ICD-10-CM | POA: Diagnosis not present

## 2021-07-26 NOTE — Therapy (Signed)
[facility] Outpatient Rehabilitation [facility] [address]  [address] [place], [place], [address] Phone: [phone]   Fax:  [phone]  Physical Therapy Treatment  Patient Details  Name: Deborah Le MRN: [id number] Date of Birth: 05-04-1946 Referring Provider (PT): Vicente Masson, MD (ED) (PCP - Princess Bruins)   Encounter Date: 07/26/2021   PT End of Session - 07/26/21 1534     Visit Number 4    Number of Visits 12    Date for PT Re-Evaluation 08/06/21    Authorization Type UHC Medicare & Medicaid    PT Start Time 1534    PT Stop Time 1613    PT Time Calculation (min) 39 min    Activity Tolerance Patient tolerated treatment well    Behavior During Therapy WFL for tasks assessed/performed             Past Medical History:  Diagnosis Date   History of heart artery stent    Hypercholesteremia    Hypertension    Overactive bladder     History reviewed. No pertinent surgical history.  There were no vitals filed for this visit.   Subjective Assessment - 07/26/21 1538     Subjective Pt reports she has no AC at home so she feels worn out all the time, esp when she has to go up/down stairs.    Patient Stated Goals "to be my old self again"    Currently in Pain? No/denies                               [facility] Adult PT Treatment/Exercise - 07/26/21 1534       High Level Balance   High Level Balance Activities Side stepping;Braiding;Tandem walking;Negotiating over obstacles;Negotitating around obstacles      Neuro Re-ed    Neuro Re-ed Details  Alt toe clears to top of cone x 10      Knee/Hip Exercises: Aerobic   Recumbent Bike L1 x 6 min      Knee/Hip Exercises: Standing   SLS 2 x 15 sec - B UE support on 1st rep, single UE support on 2nd rep    SLS with Vectors R/L 3-way tap to cones x 5 with single UE support                 Balance Exercises - 07/26/21 1534       Balance Exercises: Standing   Tandem  Stance 2 reps;30 secs;Intermittent upper extremity support    SLS 2 reps;15 secs;Upper extremity support 2;Upper extremity support 1    Tandem Gait Forward;Retro;4 reps;Intermittent upper extremity support    Sidestepping 4 reps   66ft at side of TM   Cone Rotation A/P;Solid surface   cone knock down & righting with side-step btw cones x 5 cones   Other Standing Exercises B 4-square stepping x 5 - cues to keep head upright    Other Standing Exercises Comments Toe & heel walking 2 x 37ft   intermittent UEsupport                PT Short Term Goals - 07/23/21 1600       PT SHORT TERM GOAL #1   Title Patient will be independent with initial HEP    Status Achieved    Target Date 07/16/21      PT SHORT TERM GOAL #2   Title Patient will verbalize understanding of fall prevention  measures in home to reduce risk for falls    Status On-going    Target Date 08/06/21               PT Long Term Goals - 07/19/21 1616       PT LONG TERM GOAL #1   Title Patient will be independent with ongoing/advanced HEP +/- [place] Fall Prevention Program for self-management at home    Status On-going      PT LONG TERM GOAL #2   Title Patient will demonstrate improved B LE strength to >/= 4 to 4+/5 for improved stability and ease of mobility    Status On-going      PT LONG TERM GOAL #3   Title Patient will demonstrate decreased TUG time to </= 13.5 sec to decrease risk for falls with transitional mobility    Status On-going      PT LONG TERM GOAL #4   Title Patient will improve FGA score to >/= 25/30 to improve gait stability and reduce risk for falls    Status On-going      PT LONG TERM GOAL #5   Title Patient to demonstrate symmetrical step length, knee flexion, and good heel-toe pattern with upright posture when ambulating with or w/o SPC    Status On-going                   Plan - 07/26/21 1541     Clinical Impression Statement Karen reports her HEP is going well and  states the current yellow resistance band still creates a good challenge for her - she denied need for further review today. Session focusing on progression of balance and dynamic stepping activities to promote increased stability and decrease fall risk - intermittent UE support required for some activities, but no LOB observed. She struggled most with R hip and knee flexion for cone knock down/righting in L SLS. She will continue to benefit from skilled PT for further strengthening and balance training to improve stability and activity tolerance.    Personal Factors and Comorbidities --    Comorbidities TIA 06/03/21, HTN, HLD, h/o cardiac stent, overactive bladder    Rehab Potential Good    PT Frequency 2x / week    PT Duration 6 weeks    PT Treatment/Interventions ADLs/Self Care Home Management;DME Instruction;Gait training;Stair training;Functional mobility training;Therapeutic activities;Therapeutic exercise;Balance training;Neuromuscular re-education;Patient/family education;Energy conservation    PT Next Visit Plan proximal LE strengthening and balance    Consulted and Agree with Plan of Care Patient             Patient will benefit from skilled therapeutic intervention in order to improve the following deficits and impairments:  Abnormal gait, Decreased activity tolerance, Decreased balance, Decreased endurance, Decreased knowledge of precautions, Decreased mobility, Decreased safety awareness, Decreased strength, Difficulty walking, Impaired perceived functional ability, Improper body mechanics, Postural dysfunction  Visit Diagnosis: Unsteadiness on feet  Muscle weakness (generalized)     Problem List Patient Active Problem List   Diagnosis Date Noted   CVA (cerebral vascular accident) (HCC) 06/03/2021   HTN (hypertension) 06/03/2021    Marry Guan, PT, MPT 07/26/2021, 7:47 PM  [facility] Outpatient Rehabilitation [facility] [address]  [address] [place], [place], [address] Phone: [phone]   Fax:  [phone]  Name: Marney Treloar MRN: [id number] Date of Birth: 1946/10/15

## 2021-07-30 ENCOUNTER — Encounter: Payer: Medicare Other | Admitting: Physical Therapy

## 2021-08-02 ENCOUNTER — Other Ambulatory Visit: Payer: Self-pay

## 2021-08-02 ENCOUNTER — Encounter: Payer: Self-pay | Admitting: Physical Therapy

## 2021-08-02 ENCOUNTER — Ambulatory Visit: Payer: Medicare Other | Attending: Internal Medicine | Admitting: Physical Therapy

## 2021-08-02 DIAGNOSIS — R2681 Unsteadiness on feet: Secondary | ICD-10-CM | POA: Diagnosis present

## 2021-08-02 DIAGNOSIS — M6281 Muscle weakness (generalized): Secondary | ICD-10-CM | POA: Insufficient documentation

## 2021-08-02 NOTE — Patient Instructions (Signed)
      Access Code: [email or web address] URL: [email or web address] Date: 08/02/2021 Prepared by: Glenetta Hew  Exercises Heel Toe Raises with Counter Support - 1 x daily - 7 x weekly - 2 sets - 10 reps Standing Hip Extension with Resistance at Ankles and Counter Support - 1 x daily - 7 x weekly - 2 sets - 10 reps - 3 sec hold Marching with Resistance - 1 x daily - 7 x weekly - 2 sets - 10 reps - 3 sec hold Sit to Stand with Resistance Around Legs - 1 x daily - 7 x weekly - 2 sets - 10 reps  Patient Education Check for Safety

## 2021-08-02 NOTE — Therapy (Signed)
[facility] [address]  [place], [place], [address] Phone: [phone]   Fax:  [phone]  Physical Therapy Treatment  Patient Details  Name: Deborah Le MRN: [id number] Date of Birth: February 24, 1946 Referring Provider (PT): Alita Chyle, MD (ED) (PCP - Gerline Legacy)   Encounter Date: 08/02/2021   PT End of Session - 08/02/21 1533     Visit Number 5    Number of Visits 12    Date for PT Re-Evaluation 08/06/21    Authorization Type UHC Medicare & Medicaid    PT Start Time 1950    PT Stop Time 1615    PT Time Calculation (min) 42 min    Activity Tolerance Patient tolerated treatment well    Behavior During Therapy WFL for tasks assessed/performed             Past Medical History:  Diagnosis Date   History of heart artery stent    Hypercholesteremia    Hypertension    Overactive bladder     History reviewed. No pertinent surgical history.  There were no vitals filed for this visit.   Subjective Assessment - 08/02/21 1536     Subjective Pt feels like she might be ready for more resistance with her HEP.    Patient Stated Goals "to be my old self again"    Currently in Pain? No/denies                [facility] PT Assessment - 08/02/21 1533       Assessment   Medical Diagnosis TIA - L sided numbness and tingling; Impaired balance    Referring Provider (PT) Alita Chyle, MD (ED)   PCP - Gerline Legacy   Onset Date/Surgical Date 06/03/21      Strength   Right Hip Flexion 4+/5    Right Hip Extension 4+/5    Right Hip External Rotation  4+/5    Right Hip Internal Rotation 5/5    Right Hip ABduction 4+/5    Right Hip ADduction 4+/5    Left Hip Flexion 4+/5    Left Hip Extension 4+/5    Left Hip External Rotation 4+/5    Left Hip Internal Rotation 5/5    Left Hip ABduction 4+/5    Left Hip ADduction 4+/5    Right Knee Flexion 4+/5    Right Knee Extension 4+/5    Left Knee Flexion 4+/5     Left Knee Extension 4+/5    Right Ankle Dorsiflexion 4+/5    Right Ankle Plantar Flexion 5/5    Left Ankle Dorsiflexion 4+/5    Left Ankle Plantar Flexion 5/5      Ambulation/Gait   Ambulation/Gait Assistance 7: Independent    Assistive device None    Gait Pattern Within Functional Limits    Ambulation Surface Level;Indoor    Gait velocity 3.18 ft/sec      Standardized Balance Assessment   10 Meter Walk 10.31 sec      Timed Up and Go Test   Normal TUG (seconds) 12.28      Functional Gait  Assessment   Gait Level Surface Walks 20 ft in less than 5.5 sec, no assistive devices, good speed, no evidence for imbalance, normal gait pattern, deviates no more than 6 in outside of the 12 in walkway width.    Change in Gait Speed Able to smoothly change walking speed without loss of balance or gait deviation. Deviate no more than 6 in  outside of the 12 in walkway width.    Gait with Horizontal Head Turns Performs head turns smoothly with no change in gait. Deviates no more than 6 in outside 12 in walkway width    Gait with Vertical Head Turns Performs head turns with no change in gait. Deviates no more than 6 in outside 12 in walkway width.    Gait and Pivot Turn Pivot turns safely within 3 sec and stops quickly with no loss of balance.    Step Over Obstacle Is able to step over 2 stacked shoe boxes taped together (9 in total height) without changing gait speed. No evidence of imbalance.    Gait with Narrow Base of Support Ambulates less than 4 steps heel to toe or cannot perform without assistance.    Gait with Eyes Closed Walks 20 ft, uses assistive device, slower speed, mild gait deviations, deviates 6-10 in outside 12 in walkway width. Ambulates 20 ft in less than 9 sec but greater than 7 sec.    Ambulating Backwards Walks 20 ft, uses assistive device, slower speed, mild gait deviations, deviates 6-10 in outside 12 in walkway width.    Steps Alternating feet, must use rail.    Total Score  24    FGA comment: 19-24 = medium risk fall                           [facility] Adult PT Treatment/Exercise - 08/02/21 1533       Knee/Hip Exercises: Aerobic   Nustep L3 x 6 min      Knee/Hip Exercises: Standing   Heel Raises Both;20 reps;2 seconds    Heel Raises Limitations heel/toe raises    Hip Flexion Both;10 reps;Stengthening;Knee bent    Hip Flexion Limitations marching with looped red TB at midfoot    Hip Extension Right;Left;10 reps;Stengthening;Knee straight    Extension Limitations looped red TB at ankles      Knee/Hip Exercises: Seated   Sit to Sand 10 reps;without UE support   + red TB hip ABD isometric                   PT Education - 08/02/21 1615     Education Details HEP review & update - Access Code: [email or web address]    Person(s) Educated Patient    Methods Explanation;Demonstration;Verbal cues;Handout    Comprehension Verbalized understanding;Verbal cues required;Returned demonstration;Need further instruction              PT Short Term Goals - 08/02/21 1551       PT SHORT TERM GOAL #1   Title Patient will be independent with initial HEP    Status Achieved    Target Date 07/16/21      PT SHORT TERM GOAL #2   Title Patient will verbalize understanding of fall prevention measures in home to reduce risk for falls    Status Partially Met   08/02/21 - Pt provided with home safety checklist   Target Date 08/06/21               PT Long Term Goals - 08/02/21 1551       PT LONG TERM GOAL #1   Title Patient will be independent with ongoing/advanced HEP +/- [place] Fall Prevention Program for self-management at home    Status Partially Met   08/02/21 - met for existing HEP with updates provided today   Target Date 08/06/21      PT  LONG TERM GOAL #2   Title Patient will demonstrate improved B LE strength to >/= 4 to 4+/5 for improved stability and ease of mobility    Status Achieved   08/02/21     PT LONG TERM GOAL #3   Title Patient  will demonstrate decreased TUG time to </= 13.5 sec to decrease risk for falls with transitional mobility    Baseline 15.59 sec    Status Achieved   08/02/21 - TUG = 12.28 sec     PT LONG TERM GOAL #4   Title Patient will improve FGA score to >/= 25/30 to improve gait stability and reduce risk for falls    Baseline 17/30    Status Partially Met   08/02/21 - good progress with FGA now 24/30   Target Date 08/06/21      PT LONG TERM GOAL #5   Title Patient to demonstrate symmetrical step length, knee flexion, and good heel-toe pattern with upright posture when ambulating with or w/o SPC    Status Achieved   08/02/21                  Plan - 08/02/21 1615     Clinical Impression Statement Deborah Le reports her grandson has encouraged her to walk w/o her cane more and she feels steady w/o the cane. LE strength has improved since start of PT with overall MMT now 4+/5 to 5/5. Improvements also noted on all balance testing with gait speed increased from 2.75 ft/sec to 3.18 ft/sec, TUG decreased from 15.59 sec to 12.28 sec, and FGA improved from 17/30 to 24/30 - all indicating a decreased fall risk and improved safety with community mobility. Majority of goals now met or nearly met, and Deborah Le feels comfortable with plan for transition to HEP as of end of POC next following completion of final HEP review and update as indicated which was initiated today.    Comorbidities TIA 06/03/21, HTN, HLD, h/o cardiac stent, overactive bladder    Rehab Potential Good    PT Frequency 2x / week    PT Duration 6 weeks    PT Treatment/Interventions ADLs/Self Care Home Management;DME Instruction;Gait training;Stair training;Functional mobility training;Therapeutic activities;Therapeutic exercise;Balance training;Neuromuscular re-education;Patient/family education;Energy conservation    PT Next Visit Plan proximal LE strengthening and balance    PT Home Exercise Plan Access Code: [email or web address] (7/21, updated 8/4)     Consulted and Agree with Plan of Care Patient             Patient will benefit from skilled therapeutic intervention in order to improve the following deficits and impairments:  Abnormal gait, Decreased activity tolerance, Decreased balance, Decreased endurance, Decreased knowledge of precautions, Decreased mobility, Decreased safety awareness, Decreased strength, Difficulty walking, Impaired perceived functional ability, Improper body mechanics, Postural dysfunction  Visit Diagnosis: Unsteadiness on feet  Muscle weakness (generalized)     Problem List Patient Active Problem List   Diagnosis Date Noted   CVA (cerebral vascular accident) ([place]) 06/03/2021   HTN (hypertension) 06/03/2021    Percival Spanish, PT, MPT 08/02/2021, 8:01 PM  [facility] [address]  [place], [place], [address] Phone: [phone]   Fax:  [phone]  Name: Deborah Le MRN: [id number] Date of Birth: 03/31/1946

## 2021-08-06 ENCOUNTER — Other Ambulatory Visit: Payer: Self-pay

## 2021-08-06 ENCOUNTER — Ambulatory Visit: Payer: Medicare Other | Admitting: Physical Therapy

## 2021-08-06 ENCOUNTER — Encounter: Payer: Self-pay | Admitting: Physical Therapy

## 2021-08-06 DIAGNOSIS — M6281 Muscle weakness (generalized): Secondary | ICD-10-CM

## 2021-08-06 DIAGNOSIS — R2681 Unsteadiness on feet: Secondary | ICD-10-CM | POA: Diagnosis not present

## 2021-08-06 NOTE — Patient Instructions (Addendum)
      Access Code: [email or web address] URL: [email or web address] Date: 08/06/2021 Prepared by: Glenetta Hew  Exercises Heel Toe Raises with Counter Support - 1 x daily - 7 x weekly - 2 sets - 10 reps Standing Hip Extension with Resistance at Ankles and Counter Support - 1 x daily - 7 x weekly - 2 sets - 10 reps - 3 sec hold Marching with Resistance - 1 x daily - 7 x weekly - 2 sets - 10 reps - 3 sec hold Sit to Stand with Resistance Around Legs - 1 x daily - 7 x weekly - 2 sets - 10 reps Side Stepping with Resistance at Ankles and Counter Support - 1 x daily - 7 x weekly - 2 sets - 10 reps Forward Monster Walk with Resistance at Ankles and Counter Support - 1 x daily - 7 x weekly - 2 sets - 10 reps Standing Single Leg Stance with Unilateral Counter Support - 1 x daily - 7 x weekly - 3 reps - 10-15 sec hold Standing Tandem Balance with Unilateral Counter Support - 1 x daily - 7 x weekly - 3 reps - 20-30 sec hold Tandem Walking with Counter Support - 1 x daily - 7 x weekly - 2 sets - 10 reps  Patient Education Check for Safety

## 2021-08-06 NOTE — Therapy (Addendum)
PHYSICAL THERAPY DISCHARGE SUMMARY (10/09/2021)  Visits from Start of Care: 6  Current functional level related to goals / functional outcomes: Independent, all goals met, decreased risk of falls   Remaining deficits: See progress note below   Education / Equipment: HEP  Plan: Patient agrees to discharge.   She was appropriate for discharge and last seen on 08/06/2021, placed on 30 day hold, and has not returned during that interval.  Patient is being discharged due to meeting the stated rehab goals.     Rennie Natter, PT   [facility] [address]  [place], [place], [address] Phone: [phone]   Fax:  [phone]  Physical Therapy Treatment / Progress Note  Patient Details  Name: Deborah Le MRN: [id number] Date of Birth: [birth year] Referring Provider (PT): Alita Chyle, MD (ED) Gerline Legacy, MD (PCP))  Progress Note  Reporting Period 06/25/2021 to 08/06/2021  See note below for Objective Data and Assessment of Progress/Goals.     Encounter Date: 08/06/2021   PT End of Session - 08/06/21 1534     Visit Number 6    Number of Visits 12    Date for PT Re-Evaluation 08/06/21    Authorization Type UHC Medicare & Medicaid    PT Start Time 1534    PT Stop Time 1615    PT Time Calculation (min) 41 min    Activity Tolerance Patient tolerated treatment well    Behavior During Therapy WFL for tasks assessed/performed             Past Medical History:  Diagnosis Date   History of heart artery stent    Hypercholesteremia    Hypertension    Overactive bladder     History reviewed. No pertinent surgical history.  There were no vitals filed for this visit.   Subjective Assessment - 08/06/21 1539     Subjective Pt reports she felt good after trying out the updated HEP at home.    Patient Stated Goals "to be my old self again"    Currently in Pain? No/denies                [facility] PT  Assessment - 08/06/21 1534       Assessment   Medical Diagnosis TIA - L sided numbness and tingling; Impaired balance    Referring Provider (PT) Alita Chyle, MD (ED)   Gerline Legacy, MD (PCP)   Onset Date/Surgical Date 06/03/21    Next MD Visit Sept 2022                           [facility] Adult PT Treatment/Exercise - 08/06/21 1534       Knee/Hip Exercises: Aerobic   Nustep L4 x 6 min      Knee/Hip Exercises: Standing   Other Standing Knee Exercises B side stepping & fwd/back monster walk with looped red TB at ankles                 Balance Exercises - 08/06/21 1534       Balance Exercises: Standing   Tandem Stance 2 reps;30 secs;Intermittent upper extremity support    SLS 2 reps;15 secs;Upper extremity support 1;Intermittent upper extremity support    Tandem Gait Forward;4 reps   10 ft along counter              PT Education - 08/06/21 1614     Education Details  Final HEP review & update - Access Code: [email or web address]    Person(s) Educated Patient    Methods Explanation;Demonstration;Handout    Comprehension Verbalized understanding;Returned demonstration              PT Short Term Goals - 08/06/21 1541       PT SHORT TERM GOAL #1   Title Patient will be independent with initial HEP    Status Achieved   07/23/21     PT SHORT TERM GOAL #2   Title Patient will verbalize understanding of fall prevention measures in home to reduce risk for falls    Status Achieved   08/06/21              PT Long Term Goals - 08/06/21 1542       PT LONG TERM GOAL #1   Title Patient will be independent with ongoing/advanced HEP +/- [place] Fall Prevention Program for self-management at home    Status Achieved   08/06/21     PT LONG TERM GOAL #2   Title Patient will demonstrate improved B LE strength to >/= 4 to 4+/5 for improved stability and ease of mobility    Status Achieved   08/02/21     PT LONG TERM GOAL #3   Title Patient will demonstrate  decreased TUG time to </= 13.5 sec to decrease risk for falls with transitional mobility    Baseline 15.59 sec    Status Achieved   08/02/21 - TUG = 12.28 sec     PT LONG TERM GOAL #4   Title Patient will improve FGA score to >/= 25/30 to improve gait stability and reduce risk for falls    Baseline 17/30    Status Partially Met   08/02/21 - good progress with FGA now 24/30     PT LONG TERM GOAL #5   Title Patient to demonstrate symmetrical step length, knee flexion, and good heel-toe pattern with upright posture when ambulating with or w/o SPC    Status Achieved   08/02/21                  Plan - 08/06/21 1542     Clinical Impression Statement Deborah Le reports increased confidence with mobility and walking since coming to PT following her TIA. She has demonstrated excellent progress with PT with good gains in overall LE strength (now 4+/5 to 5/5) and improvements also noted on all balance testing (gait speed increased from 2.75 ft/sec to 3.18 ft/sec, TUG decreased from 15.59 sec to 12.28 sec, and FGA improved from 17/30 to 24/30) - all indicating a decreased fall risk and improved safety with community mobility. We have completed a thorough review of her HEP and she was able to provide good return demonstration of all HEP exercises/activities. All goals now met or nearly met, and Deborah Le feels comfortable with plan for transition to HEP with a 30-day hold in the event that issues arise with her HEP that would necessitate a return to PT.    Comorbidities TIA 06/03/21, HTN, HLD, h/o cardiac stent, overactive bladder    Rehab Potential Good    PT Frequency --    PT Duration --    PT Treatment/Interventions ADLs/Self Care Home Management;DME Instruction;Gait training;Stair training;Functional mobility training;Therapeutic activities;Therapeutic exercise;Balance training;Neuromuscular re-education;Patient/family education;Energy conservation    PT Next Visit Plan transition to HEP + 30-day hold     PT Home Exercise Plan Access Code: [email or web address] (7/21, updated 8/4 & 8/8)    Consulted and Agree  with Plan of Care Patient             Patient will benefit from skilled therapeutic intervention in order to improve the following deficits and impairments:  Abnormal gait, Decreased activity tolerance, Decreased balance, Decreased endurance, Decreased knowledge of precautions, Decreased mobility, Decreased safety awareness, Decreased strength, Difficulty walking, Impaired perceived functional ability, Improper body mechanics, Postural dysfunction  Visit Diagnosis: Unsteadiness on feet  Muscle weakness (generalized)     Problem List Patient Active Problem List   Diagnosis Date Noted   CVA (cerebral vascular accident) ([place]) 06/03/2021   HTN (hypertension) 06/03/2021    Percival Spanish, PT, MPT 08/06/2021, 4:25 PM  [facility] [address]  [place], [place], [address] Phone: [phone]   Fax:  [phone]  Name: Deborah Le MRN: [id number] Date of Birth: March 20, 1946

## 2021-08-09 ENCOUNTER — Encounter: Payer: Medicare Other | Admitting: Physical Therapy

## 2021-08-31 ENCOUNTER — Other Ambulatory Visit: Payer: Self-pay | Admitting: Student

## 2021-09-05 ENCOUNTER — Other Ambulatory Visit: Payer: Self-pay | Admitting: Student
